# Patient Record
Sex: Female | Born: 1995
Health system: Southern US, Community
[De-identification: ages and names within clinical notes are randomized; demographics above are authoritative.]

## PROBLEM LIST (undated history)

## (undated) DIAGNOSIS — F419 Anxiety disorder, unspecified: Secondary | ICD-10-CM

## (undated) DIAGNOSIS — F952 Tourette's disorder: Secondary | ICD-10-CM

## (undated) DIAGNOSIS — F32A Depression, unspecified: Secondary | ICD-10-CM

## (undated) DIAGNOSIS — F329 Major depressive disorder, single episode, unspecified: Secondary | ICD-10-CM

## (undated) DIAGNOSIS — G43909 Migraine, unspecified, not intractable, without status migrainosus: Secondary | ICD-10-CM

## (undated) DIAGNOSIS — T7840XA Allergy, unspecified, initial encounter: Secondary | ICD-10-CM

## (undated) HISTORY — DX: Allergy, unspecified, initial encounter: T78.40XA

## (undated) HISTORY — PX: NO PAST SURGERIES: SHX2092

---
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
## 2015-09-21 DIAGNOSIS — H1031 Unspecified acute conjunctivitis, right eye: Secondary | ICD-10-CM | POA: Diagnosis not present

## 2015-09-21 DIAGNOSIS — H1011 Acute atopic conjunctivitis, right eye: Secondary | ICD-10-CM | POA: Diagnosis not present

## 2015-09-21 MED FILL — predniSONE 20 MG TABS: 20 | 3 days supply | Qty: 6 | Fill #0

## 2015-09-21 MED FILL — raNITIdine HCL 150 MG TABS: 150 | 15 days supply | Qty: 30 | Fill #0

## 2016-05-02 DIAGNOSIS — Z30011 Encounter for initial prescription of contraceptive pills: Secondary | ICD-10-CM | POA: Diagnosis not present

## 2016-05-02 MED FILL — LARIN FE 1.5-30 TABLET: 1.5-30 | 28 days supply | Qty: 28 | Fill #0

## 2016-06-03 MED FILL — LARIN FE 1.5-30 TABLET: 1.5-30 | 84 days supply | Qty: 84 | Fill #1

## 2016-08-26 MED FILL — LARIN FE 1.5-30 TABLET: 1.5-30 | 84 days supply | Qty: 84 | Fill #2

## 2016-11-18 MED FILL — LARIN FE 1.5-30 TABLET: 1.5-30 | 84 days supply | Qty: 84 | Fill #3

## 2016-11-27 ENCOUNTER — Other Ambulatory Visit: Payer: Self-pay | Admitting: Family Medicine

## 2016-11-27 ENCOUNTER — Ambulatory Visit
Admission: RE | Admit: 2016-11-27 | Discharge: 2016-11-27 | Disposition: A | Discharge status: Home / Self Care | Payer: 59 | Source: Ambulatory Visit | Attending: Family Medicine | Admitting: Family Medicine

## 2016-11-27 DIAGNOSIS — M545 Low back pain, unspecified: Secondary | ICD-10-CM

## 2016-11-27 MED FILL — NAPROXEN 500 MG TABLET: 500 | 30 days supply | Qty: 60 | Fill #0

## 2016-11-27 MED FILL — CYCLOBENZAPRINE 10 MG TAB: 10 | 30 days supply | Qty: 30 | Fill #0

## 2017-01-07 ENCOUNTER — Ambulatory Visit: Payer: 59 | Attending: Family Medicine | Admitting: Physical Therapy

## 2017-01-07 ENCOUNTER — Encounter: Payer: Self-pay | Admitting: Physical Therapy

## 2017-01-07 DIAGNOSIS — M6281 Muscle weakness (generalized): Secondary | ICD-10-CM | POA: Diagnosis not present

## 2017-01-07 DIAGNOSIS — M545 Low back pain: Secondary | ICD-10-CM | POA: Diagnosis not present

## 2017-01-07 DIAGNOSIS — M62830 Muscle spasm of back: Secondary | ICD-10-CM | POA: Diagnosis not present

## 2017-01-07 DIAGNOSIS — G8929 Other chronic pain: Secondary | ICD-10-CM | POA: Insufficient documentation

## 2017-01-07 NOTE — Therapy (Signed)
[place], [place], [address] Phone: [phone]   Fax:  [phone]  Physical Therapy Evaluation  Patient Details  Name: Julie Warner MRN: [id number] Date of Birth: [birth year] Referring Provider: Alben Spittle MD  Encounter Date: 01/07/2017      PT End of Session - 01/07/17 1428    Visit Number 1   Number of Visits 13   Date for PT Re-Evaluation 02/18/17   PT Start Time 1330   PT Stop Time 1420   PT Time Calculation (min) 50 min   Activity Tolerance Patient tolerated treatment well   Behavior During Therapy [facility] for tasks assessed/performed      Past Medical History:  Diagnosis Date  . Allergy    seasonal     History reviewed. No pertinent surgical history.  There were no vitals filed for this visit.       Subjective Assessment - 01/07/17 1337    Subjective pt is a 21 y.o F with CC of low back pain that started about 4 years ago with no specific MOI which she thinks is from overdoing it. that was at it worst 1 year ago and she quit her job. and recently was exacerbated in July with no specific cause. pain is in low back and at worst can radiate up to the mid back. Denies N/T into the LE. since recent exacerbation in July pain seems to stay the same.    Limitations --  bending to far. or sitting up straight   How long can you sit comfortably? depends on pain level but 15- to 30 min   How long can you stand comfortably? 60 min   How long can you walk comfortably? 60 min    Diagnostic tests x-ray   Patient Stated Goals strength the back, fix the problem, decrease pain,    Currently in Pain? Yes   Pain Score 4    Pain Location Back   Pain Orientation Lower;Mid   Pain Descriptors / Indicators Tightness;Sore;Sharp   Pain Type Chronic pain   Pain Onset More than a month ago   Pain Frequency Intermittent   Aggravating Factors  bending, prolonged sitting, lifting/ carrying,    Pain Relieving  Factors getting out of the positiong, resting,             [facility] PT Assessment - 01/07/17 1329      Assessment   Medical Diagnosis Low back pain   Referring Provider Alben Spittle MD   Onset Date/Surgical Date --  4 years with recent July of 2018   Hand Dominance Right   Next MD Visit --  make on PRN   Prior Therapy no     Precautions   Precaution Comments stay off as much as possible, avoid strenous activity     Restrictions   Weight Bearing Restrictions No     Balance Screen   Has the patient fallen in the past 6 months No   Has the patient had a decrease in activity level because of a fear of falling?  No   Is the patient reluctant to leave their home because of a fear of falling?  No     Home Social worker Private residence   Living Arrangements Spouse/significant other   Available Help at Discharge Available PRN/intermittently   Type of [place] to enter   Entrance Stairs-Number of Steps 4   Entrance Stairs-Rails None  Home Layout Two level   Alternate Level Stairs-Number of Steps 14   Alternate Level Stairs-Rails Right   Home Equipment None     Prior Function   Level of Independence Independent;Independent with basic ADLs   Vocation Part time employment  subbing daycare   Vocation Requirements standing, walking, bending, sitting,    Leisure art, reading,      Cognition   Overall Cognitive Status Within Functional Limits for tasks assessed     Observation/Other Assessments   Focus on Therapeutic Outcomes (FOTO)  48% limited  predicted 32% limited     Posture/Postural Control   Posture/Postural Control Postural limitations     ROM / Strength   AROM / PROM / Strength AROM;Strength     AROM   AROM Assessment Site Lumbar   Lumbar Flexion 42  ERP   Lumbar Extension 20  ERP   Lumbar - Right Side Bend 10   Lumbar - Left Side Bend 10     Strength   Strength Assessment Site Hip;Knee   Right/Left Hip  Right;Left   Right Hip Flexion 4+/5   Right Hip Extension 4-/5   Right Hip ABduction 3+/5   Right Hip ADduction 4-/5   Left Hip Flexion 4+/5   Left Hip Extension 3+/5   Left Hip ABduction 3+/5   Left Hip ADduction 4-/5   Right/Left Knee Right;Left   Right Knee Flexion 5/5   Right Knee Extension 5/5   Left Knee Flexion 5/5   Left Knee Extension 5/5     Palpation   SI assessment  postivie flexion test with L PSIS limited movement.  (-) gillets test, (+) gaenslens test   Palpation comment TTP at the L PSIS, and L lumbar parapsinals     Special Tests    Special Tests Lumbar;Sacrolliac Tests   Sacroiliac Tests  Sacral Thrust     Sacral thrust    Findings Positive   Side Left            Objective measurements completed on examination: See above findings.          [place] Adult PT Treatment/Exercise - 01/07/17 1329      Lumbar Exercises: Stretches   Active Hamstring Stretch 3 reps;30 seconds  contract/ relax   Pelvic Tilt --  1 x 10 with 3 sec hold     Lumbar Exercises: Supine   Straight Leg Raise 15 reps  x 2 sets                PT Education - 01/07/17 1353    Education provided Yes   Education Details evaluation findings, POC, goals, HEP with form/ rationale. anatomy regarding low back and SIJ and muscle effects on SIJ   Person(s) Educated Patient;Spouse   Methods Explanation;Verbal cues;Handout   Comprehension Verbalized understanding;Verbal cues required          PT Short Term Goals - 01/07/17 1436      PT SHORT TERM GOAL #1   Title pt will be I with inital HEP   Time 3   Period Weeks   Status New   Target Date 01/28/17     PT SHORT TERM GOAL #2   Title pt will verbalize and demo proper posture with lifting and carrying mechanics to prevent / reduce low back pain   Time 3   Period Weeks   Status New   Target Date 01/28/17           PT Long Term Goals -  01/07/17 1438      PT LONG TERM GOAL #1   Title pt will increase trunk  flexion to >/= 70 degrees and extension/ bil side bending by >/= 8 degrees to promote functional mobility for ADLS   Time 6   Period Weeks   Status New   Target Date 02/18/17     PT LONG TERM GOAL #2   Title increase bil hip/ knee strength to >/= 4+/5 to promote hip stability and assist with proper lifting mechanics during job related activities    Time 6   Period Weeks   Status New   Target Date 02/18/17     PT LONG TERM GOAL #3   Title pt to increase FOTO score to </=32% limited to demo improvement in function   Time 6   Period Weeks   Status New   Target Date 02/18/17     PT LONG TERM GOAL #4   Title pt to be I with all HEP given as of last visit    Time 6   Period Weeks   Status New   Target Date 02/18/17                Plan - 01/07/17 1429    Clinical Impression Statement pt presents to OPPT with CC of chronic low back pain that started 4 years ago with recent exacerbationg in July with no specific MOI. She demonstrates limited trunk flexion/ extension due to pain and muscle tightness. speciall testing is consistent with posterior innominate rotation on the L and TTP at the L PSIS and L lumbar parapsinals. following hamstring stretching and SLR exericse she reported decreased pain with trunk flexion. she would benefit from physical therapy to decrease low back pain, improve trunk mobility, increase LE strength and return to PLOF by addressing the deficits listed below.    Clinical Presentation Stable   Clinical Decision Making Low   Rehab Potential Good   PT Frequency 2x / week   PT Duration 6 weeks   PT Treatment/Interventions ADLs/Self Care Home Management;Cryotherapy;Electrical Stimulation;Iontophoresis 4mg /ml Dexamethasone;Moist Heat;Ultrasound;Therapeutic activities;Therapeutic exercise;Dry needling;Manual techniques;Taping;Patient/family education;Passive range of motion;Neuromuscular re-education   PT Next Visit Plan review and updated HEP PRN, L innominate  posterior rotation, hamstring stretching, METs, core work, manual for lumbar paraspinals, cat/cow   PT Home Exercise Plan lower trunk rotation, SLR, hamstring stretch, posterior pelvic tilt   Consulted and Agree with Plan of Care Patient      Patient will benefit from skilled therapeutic intervention in order to improve the following deficits and impairments:  Pain, Decreased balance, Decreased strength, Decreased range of motion, Decreased endurance, Decreased activity tolerance, Increased muscle spasms, Impaired flexibility, Improper body mechanics, Postural dysfunction  Visit Diagnosis: Chronic bilateral low back pain without sciatica - Plan: PT plan of care cert/re-cert  Muscle weakness (generalized) - Plan: PT plan of care cert/re-cert  Muscle spasm of back - Plan: PT plan of care cert/re-cert     Problem List There are no active problems to display for this patient.  Starr [place] PT, DPT, LAT, ATC  01/07/17  2:47 PM      [facility] [address] [place], [place], [address] Phone: [phone]   Fax:  [phone]  Name: Julie Warner MRN: [id number] Date of Birth: [birth year]

## 2017-01-14 ENCOUNTER — Ambulatory Visit: Payer: 59 | Admitting: Physical Therapy

## 2017-01-14 ENCOUNTER — Encounter: Payer: Self-pay | Admitting: Physical Therapy

## 2017-01-14 DIAGNOSIS — M62830 Muscle spasm of back: Secondary | ICD-10-CM | POA: Diagnosis not present

## 2017-01-14 DIAGNOSIS — M6281 Muscle weakness (generalized): Secondary | ICD-10-CM | POA: Diagnosis not present

## 2017-01-14 DIAGNOSIS — M545 Low back pain, unspecified: Secondary | ICD-10-CM

## 2017-01-14 DIAGNOSIS — G8929 Other chronic pain: Secondary | ICD-10-CM | POA: Diagnosis not present

## 2017-01-14 NOTE — Therapy (Signed)
[place], [place], [address] Phone: [phone]   Fax:  [phone]  Physical Therapy Treatment  Patient Details  Name: Julie Warner MRN: [id number] Date of Birth: 07/20/1995 Referring Provider: Alben Spittle MD  Encounter Date: 01/14/2017      PT End of Session - 01/14/17 1707    Visit Number 2   Number of Visits 13   Date for PT Re-Evaluation 02/18/17   PT Start Time 1615   Activity Tolerance Patient tolerated treatment well   Behavior During Therapy [facility] for tasks assessed/performed      Past Medical History:  Diagnosis Date  . Allergy    seasonal     History reviewed. No pertinent surgical history.  There were no vitals filed for this visit.      Subjective Assessment - 01/14/17 1619    Subjective Medications will bring to next session.   As  long as I did the exercises I don't  have so much pain.  3/10 pain today.  After last visit,  the next day left leg was weak all day,  it was improving the next day    and continues today.  Her knee collapses the next morning,  no pain,  she felt her knee was loose in the bones.     Patient is accompained by: Family member  Husband   Currently in Pain? Yes   Pain Score 3    Pain Location Back   Pain Orientation Mid;Lower   Pain Descriptors / Indicators Sore   Pain Type Chronic pain   Pain Frequency Intermittent   Aggravating Factors  bending,  lifting / carrying,  longer sitting   Pain Relieving Factors exercises   Effect of Pain on Daily Activities Previous injury to left knee.  Knee gets sore.                          [place] Adult PT Treatment/Exercise - 01/14/17 0001      Lumbar Exercises: Stretches   Passive Hamstring Stretch 3 reps;30 seconds   Pelvic Tilt --  1 x 10 with 3 sec hold   Pelvic Tilt Limitations cued to avoid using legs.      Lumbar Exercises: Supine   Bridge 20 reps   Straight Leg Raise 20 reps  right      Lumbar Exercises: Sidelying   Other Sidelying Lumbar Exercises Quadratus lumborum stretch over pillow ,  HEP helped decrease pain.      Knee/Hip Exercises: Stretches   Sports administrator 3 reps;30 seconds   Hip Flexor Stretch 1 rep;30 seconds   Hip Flexor Stretch Limitations non tight today     Moist Heat Therapy   Number Minutes Moist Heat 15 Minutes   Moist Heat Location Lumbar Spine  Quadratus lumborum area     Electrical Stimulation   Electrical Stimulation Location Left low back gluteal   Electrical Stimulation Action IFC   Electrical Stimulation Parameters 5   Electrical Stimulation Goals Pain  concurrent with moist heat while stretching QL over pillow     Manual Therapy   Manual therapy comments increased pain to 5/10 with manual to quadratus lumborum..  Demo other QL stretches to try as needed.  ,,QL info sheet also issued and reviewed.                PT Education - 01/14/17 1707    Education provided Yes   Education Details HEP,  Quadratus lumborum stretches.   Person(s) Educated Patient;Spouse   Methods Explanation;Demonstration;Verbal cues;Handout   Comprehension Verbalized understanding;Returned demonstration          PT Short Term Goals - 01/07/17 1436      PT SHORT TERM GOAL #1   Title pt will be I with inital HEP   Time 3   Period Weeks   Status New   Target Date 01/28/17     PT SHORT TERM GOAL #2   Title pt will verbalize and demo proper posture with lifting and carrying mechanics to prevent / reduce low back pain   Time 3   Period Weeks   Status New   Target Date 01/28/17           PT Long Term Goals - 01/07/17 1438      PT LONG TERM GOAL #1   Title pt will increase trunk flexion to >/= 70 degrees and extension/ bil side bending by >/= 8 degrees to promote functional mobility for ADLS   Time 6   Period Weeks   Status New   Target Date 02/18/17     PT LONG TERM GOAL #2   Title increase bil hip/ knee strength to >/= 4+/5 to  promote hip stability and assist with proper lifting mechanics during job related activities    Time 6   Period Weeks   Status New   Target Date 02/18/17     PT LONG TERM GOAL #3   Title pt to increase FOTO score to </=32% limited to demo improvement in function   Time 6   Period Weeks   Status New   Target Date 02/18/17     PT LONG TERM GOAL #4   Title pt to be I with all HEP given as of last visit    Time 6   Period Weeks   Status New   Target Date 02/18/17               Plan - 01/14/17 1719    Clinical Impression Statement Pain decreased with PT exercises  with decreased posterior rotation of left innominate.   Pain in left quadratus lumborum increased with manual  to 5/10.  pain decreased to 2/10 post modalities and stretching.    PT Next Visit Plan review and updated HEP PRN, L innominate posterior rotation, hamstring stretching, METs, core work, manual for lumbar paraspinals, cat/cow   PT Home Exercise Plan lower trunk rotation, SLR, hamstring stretch, posterior pelvic tilt,  QL stretching.   Consulted and Agree with Plan of Care Patient;Family member/caregiver   Family Member Consulted Husband      Patient will benefit from skilled therapeutic intervention in order to improve the following deficits and impairments:  Pain, Decreased balance, Decreased strength, Decreased range of motion, Decreased endurance, Decreased activity tolerance, Increased muscle spasms, Impaired flexibility, Improper body mechanics, Postural dysfunction  Visit Diagnosis: Chronic bilateral low back pain without sciatica  Muscle weakness (generalized)  Muscle spasm of back     Problem List There are no active problems to display for this patient.   HARRIS,KAREN PTA 01/14/2017, 5:24 PM  [facility] [address] [place], [place], [address] Phone: [phone]   Fax:  [phone]  Name: Julie Warner MRN: [id number] Date of  Birth: 08-Nov-1995

## 2017-01-14 NOTE — Patient Instructions (Signed)
From exercise drawer:  Quadratus stretches to be done PRN 3-5 X each 5  To 30 minutes each/   All issued for her to try.

## 2017-01-19 ENCOUNTER — Encounter: Payer: Self-pay | Admitting: Physical Therapy

## 2017-01-19 ENCOUNTER — Ambulatory Visit: Payer: 59 | Admitting: Physical Therapy

## 2017-01-19 DIAGNOSIS — M545 Low back pain, unspecified: Secondary | ICD-10-CM

## 2017-01-19 DIAGNOSIS — G8929 Other chronic pain: Secondary | ICD-10-CM

## 2017-01-19 DIAGNOSIS — M62830 Muscle spasm of back: Secondary | ICD-10-CM

## 2017-01-19 DIAGNOSIS — M6281 Muscle weakness (generalized): Secondary | ICD-10-CM

## 2017-01-19 NOTE — Therapy (Signed)
[place], [place], [address] Phone: [phone]   Fax:  [phone]  Physical Therapy Treatment  Patient Details  Name: Julie Warner MRN: [id number] Date of Birth: 1995-08-05 Referring Provider: Alben Spittle MD  Encounter Date: 01/19/2017      PT End of Session - 01/19/17 1706    Visit Number 3   Number of Visits 13   Date for PT Re-Evaluation 02/18/17   PT Start Time 7371   PT Stop Time 1700   PT Time Calculation (min) 45 min   Activity Tolerance Patient tolerated treatment well   Behavior During Therapy [facility] for tasks assessed/performed      Past Medical History:  Diagnosis Date  . Allergy    seasonal     History reviewed. No pertinent surgical history.  There were no vitals filed for this visit.      Subjective Assessment - 01/19/17 1622    Subjective 2/10.  Doing her home exercises.  Stretches really help.  More relaxed today than usual.   Patient is accompained by: Family member  sister   Currently in Pain? Yes   Pain Score 2    Pain Location Back   Pain Orientation Mid;Lower   Pain Descriptors / Indicators Sore;Constant;Discomfort   Pain Type Chronic pain   Aggravating Factors  bending, lifting,  carrying,  sitting unsupported and sitting too long.   Pain Relieving Factors stretches,  driving with the seat upright,                          [facility] Adult PT Treatment/Exercise - 01/19/17 0001      Lumbar Exercises: Stretches   Passive Hamstring Stretch 3 reps;30 seconds   Passive Hamstring Stretch Limitations hard end range noted   Lower Trunk Rotation 3 reps;30 seconds  right only   Pelvic Tilt 10 seconds   Quadruped Mid Back Stretch Limitations Child's post 60 seconds X 1,  same with stretches to QL  30 seconds  X 1 each     Lumbar Exercises: Supine   Clam 5 reps  single and double,  cued for control and technique   Heel Slides 5 reps  moving about 6  inches each with abdominal bracing.     Bridge 20 reps   Straight Leg Raise 20 reps  to assist with lowering right      Lumbar Exercises: Sidelying   Clam 5 reps  each,  pilates style,  left harder to do      Knee/Hip Exercises: Stretches   Quad Stretch 3 reps;30 seconds   Quad Stretch Limitations off edge of mat                  PT Short Term Goals - 01/19/17 1707      PT SHORT TERM GOAL #1   Title pt will be I with inital HEP   Baseline independent with exercises added so far   Time 3   Period Weeks   Status On-going     PT SHORT TERM GOAL #2   Title pt will verbalize and demo proper posture with lifting and carrying mechanics to prevent / reduce low back pain   Time 3   Status Unable to assess           PT Long Term Goals - 01/07/17 1438      PT LONG TERM GOAL #1   Title pt will increase trunk  flexion to >/= 70 degrees and extension/ bil side bending by >/= 8 degrees to promote functional mobility for ADLS   Time 6   Period Weeks   Status New   Target Date 02/18/17     PT LONG TERM GOAL #2   Title increase bil hip/ knee strength to >/= 4+/5 to promote hip stability and assist with proper lifting mechanics during job related activities    Time 6   Period Weeks   Status New   Target Date 02/18/17     PT LONG TERM GOAL #3   Title pt to increase FOTO score to </=32% limited to demo improvement in function   Time 6   Period Weeks   Status New   Target Date 02/18/17     PT LONG TERM GOAL #4   Title pt to be I with all HEP given as of last visit    Time 6   Period Weeks   Status New   Target Date 02/18/17               Plan - 01/19/17 1708    Clinical Impression Statement Pain did not increase with session today,  She declined the need for modalities with a pain of 2/10.  Left innominate less rotated at the end of session.  Patient was able to tolerate beginning core stabilization.    PT Treatment/Interventions ADLs/Self Care Home  Management;Cryotherapy;Electrical Stimulation;Iontophoresis 4mg /ml Dexamethasone;Moist Heat;Ultrasound;Therapeutic activities;Therapeutic exercise;Dry needling;Manual techniques;Taping;Patient/family education;Passive range of motion;Neuromuscular re-education   PT Next Visit Plan review and updated HEP PRN, L innominate posterior rotation, hamstring stretching, METs, core work, manual for lumbar paraspinals, cat/cow   PT Home Exercise Plan lower trunk rotation, SLR, hamstring stretch, posterior pelvic tilt,  QL stretching.   Consulted and Agree with Plan of Care Patient   Family Member Consulted Sister      Patient will benefit from skilled therapeutic intervention in order to improve the following deficits and impairments:  Pain, Decreased balance, Decreased strength, Decreased range of motion, Decreased endurance, Decreased activity tolerance, Increased muscle spasms, Impaired flexibility, Improper body mechanics, Postural dysfunction  Visit Diagnosis: Chronic bilateral low back pain without sciatica  Muscle weakness (generalized)  Muscle spasm of back     Problem List There are no active problems to display for this patient.   HARRIS,KAREN  PTA 01/19/2017, 5:13 PM  [facility] [address] [place], [place], [address] Phone: [phone]   Fax:  [phone]  Name: Julie Warner MRN: [id number] Date of Birth: 04/19/1996

## 2017-01-21 ENCOUNTER — Ambulatory Visit: Payer: 59 | Admitting: Physical Therapy

## 2017-01-21 ENCOUNTER — Encounter: Payer: Self-pay | Admitting: Physical Therapy

## 2017-01-21 DIAGNOSIS — M62830 Muscle spasm of back: Secondary | ICD-10-CM | POA: Diagnosis not present

## 2017-01-21 DIAGNOSIS — M6281 Muscle weakness (generalized): Secondary | ICD-10-CM

## 2017-01-21 DIAGNOSIS — G8929 Other chronic pain: Secondary | ICD-10-CM | POA: Diagnosis not present

## 2017-01-21 DIAGNOSIS — M545 Low back pain: Principal | ICD-10-CM

## 2017-01-21 NOTE — Therapy (Signed)
[place], [place], [address] Phone: [phone]   Fax:  [phone]  Physical Therapy Treatment  Patient Details  Name: Julie Warner MRN: [id number] Date of Birth: [birth year] Referring Provider: Alben Spittle MD  Encounter Date: 01/21/2017      PT End of Session - 01/21/17 1713    Visit Number 4   Number of Visits 13   Date for PT Re-Evaluation 02/18/17   PT Start Time 7939   PT Stop Time 1703   PT Time Calculation (min) 48 min   Activity Tolerance Patient tolerated treatment well   Behavior During Therapy [facility] for tasks assessed/performed      Past Medical History:  Diagnosis Date  . Allergy    seasonal     History reviewed. No pertinent surgical history.  There were no vitals filed for this visit.      Subjective Assessment - 01/21/17 1616    Subjective Pain 6-7 /10 yesterday.  Incerased with stress.   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  Maybe my other hip was out of wack.   Patient is accompained by: Family member  husband.   Currently in Pain? Yes   Pain Score --  6-7/10 yesterday,  nothing helped yesterday.   Pain Location Back   Pain Orientation Mid;Lower   Pain Descriptors / Indicators Constant   Pain Type Chronic pain   Aggravating Factors  high stress days.   Pain Relieving Factors muscle ralaxer was the only thing that worked yesterday                         [facility] Adult PT Treatment/Exercise - 01/21/17 0001      Therapeutic Activites    Therapeutic Activities Lifting   Lifting from mat , from floor.  Correct techniques  ball and purse.     Lumbar Exercises: Stretches   Passive Hamstring Stretch 3 reps;30 seconds   Passive Hamstring Stretch Limitations 90/.90  vs strap,  each   Quadruped Mid Back Stretch --  cued to not force stretches.   Quadruped Mid Back Stretch Limitations Child's post 60 seconds X 1,  same with stretches to QL  30  seconds  X 1 each  also cat camel .  Cat feels good ,  camel decreased ROM     Lumbar Exercises: Supine   Clam 5 reps  single and double,  cued for control and technique   Clam Limitations also 10 X with red band no increased pain with abdominal bracing.      Manual Therapy   Kinesiotex Inhibit Muscle     Kinesiotix   Inhibit Muscle  Paraspinals.  2 inch anchor at sacrum then tape split in half and applied off the paper along bilateral paraspinals to around  T3 with last 2 inches no tension.   Taps applied with patient curled into flexion hands resting on mat.                  PT Education - 01/21/17 1713    Education provided Yes   Education Details How tape works.   Person(s) Educated Patient;Spouse   Methods Explanation   Comprehension Verbalized understanding          PT Short Term Goals - 01/21/17 1639      PT SHORT TERM GOAL #1   Title pt will be I with inital HEP   Baseline independent with  exercises added so far   Time 3   Period Weeks   Status On-going     PT SHORT TERM GOAL #2   Title pt will verbalize and demo proper posture with lifting and carrying mechanics to prevent / reduce low back pain   Baseline able   Time 3   Period Weeks   Status Achieved           PT Long Term Goals - 01/07/17 1438      PT LONG TERM GOAL #1   Title pt will increase trunk flexion to >/= 70 degrees and extension/ bil side bending by >/= 8 degrees to promote functional mobility for ADLS   Time 6   Period Weeks   Status New   Target Date 02/18/17     PT LONG TERM GOAL #2   Title increase bil hip/ knee strength to >/= 4+/5 to promote hip stability and assist with proper lifting mechanics during job related activities    Time 6   Period Weeks   Status New   Target Date 02/18/17     PT LONG TERM GOAL #3   Title pt to increase FOTO score to </=32% limited to demo improvement in function   Time 6   Period Weeks   Status New   Target Date 02/18/17     PT  LONG TERM GOAL #4   Title pt to be I with all HEP given as of last visit    Time 6   Period Weeks   Status New   Target Date 02/18/17               Plan - 01/21/17 1714    Clinical Impression Statement Pain 1.5 at end of session.  STG#2 met.  She is able to demonstrate correct technique with lifting. .  Decreased flexibility in lumbar spine with camel stretch.  Able to have neutral innominates with exercises today.    PT Next Visit Plan review and updated HEP PRN, Check for RT / or L innominate posterior rotation  (Patient thinks it was rotated on the right yesterday), hamstring stretching, METs, core work, manual for lumbar paraspinals, cat/cow. Assess tape for paraspinals   PT Home Exercise Plan lower trunk rotation, SLR, hamstring stretch, posterior pelvic tilt,  QL stretching.   Consulted and Agree with Plan of Care Patient;Family member/caregiver   Family Member Consulted Husband      Patient will benefit from skilled therapeutic intervention in order to improve the following deficits and impairments:     Visit Diagnosis: Chronic bilateral low back pain without sciatica  Muscle weakness (generalized)  Muscle spasm of back     Problem List There are no active problems to display for this patient.   Amare Bail PTA 01/21/2017, 5:21 PM  [facility] [address] [place], [place], [address] Phone: [phone]   Fax:  [phone]  Name: Julie Warner MRN: [id number] Date of Birth: 09-22-1995

## 2017-01-21 NOTE — Patient Instructions (Signed)
Remove tape if irritating 

## 2017-01-26 ENCOUNTER — Encounter: Payer: Self-pay | Admitting: Physical Therapy

## 2017-01-26 ENCOUNTER — Ambulatory Visit: Payer: 59 | Admitting: Physical Therapy

## 2017-01-26 DIAGNOSIS — M545 Low back pain, unspecified: Secondary | ICD-10-CM

## 2017-01-26 DIAGNOSIS — M62830 Muscle spasm of back: Secondary | ICD-10-CM | POA: Diagnosis not present

## 2017-01-26 DIAGNOSIS — G8929 Other chronic pain: Secondary | ICD-10-CM | POA: Diagnosis not present

## 2017-01-26 DIAGNOSIS — M6281 Muscle weakness (generalized): Secondary | ICD-10-CM

## 2017-01-26 NOTE — Therapy (Signed)
[place], [place], [address] Phone: [phone]   Fax:  [phone]  Physical Therapy Treatment  Patient Details  Name: Julie Warner MRN: [id number] Date of Birth: 1996/01/31 Referring Provider: Alben Spittle MD  Encounter Date: 01/26/2017      PT End of Session - 01/26/17 1647    Visit Number 5   Number of Visits 13   Date for PT Re-Evaluation 02/18/17   PT Start Time 1501   PT Stop Time 1546   PT Time Calculation (min) 45 min   Activity Tolerance Patient tolerated treatment well   Behavior During Therapy [facility] for tasks assessed/performed      Past Medical History:  Diagnosis Date  . Allergy    seasonal     History reviewed. No pertinent surgical history.  There were no vitals filed for this visit.      Subjective Assessment - 01/26/17 1508    Subjective "I can say I am doing better, still some soreness in the back with lifting/ sitting"   Currently in Pain? Yes   Pain Score 2    Pain Location Back   Pain Orientation Mid;Lower   Pain Type Chronic pain   Pain Frequency Intermittent                         [facility] Adult PT Treatment/Exercise - 01/26/17 1510      Therapeutic Activites    Therapeutic Activities --   Lifting --  ball and purse.     Lumbar Exercises: Stretches   Passive Hamstring Stretch 2 reps;30 seconds   Passive Hamstring Stretch Limitations --   Quadruped Mid Back Stretch --  cued to not force stretches.   Quadruped Mid Back Stretch Limitations Child's post 60 seconds X 1,  same with stretches to QL  30 seconds  X 1 each  also cat camel .  Cat feels good ,  camel decreased ROM     Lumbar Exercises: Seated   Sit to Stand 10 reps   Sit to Stand Limitations focusing on keeping/ maintaining good back position. (verbal/ visual cues to keep feet aligned to assist with standing from seated postion, pt kept trying to stagger legs pushing more throught  the LLE).     Lumbar Exercises: Supine   Clam 15 reps  single and double,  cued for control and technique   Clam Limitations also 10 X with red band no increased pain with abdominal bracing.    Bent Knee Raise --  2 x 12    Bent Knee Raise Limitations with red band beneath back and verbal cues to push back on to band   Other Supine Lumbar Exercises tabletop position 4 x 15 sec     Lumbar Exercises: Sidelying   Other Sidelying Lumbar Exercises Book opening  2 x 10      Lumbar Exercises: Quadruped   Other Quadruped Lumbar Exercises anterior/ posterior rocking keeping back in neutral pos.  1 x 10     Manual Therapy   Manual Therapy Soft tissue mobilization   Manual therapy comments manual trigger point release over bil lumbar paraspinal   Soft tissue mobilization DTM over bil thoracic paraspinals   Kinesiotex --     Kinesiotix   Inhibit Muscle  --                PT Education - 01/26/17 1646    Education provided Yes  Education Details sit to standing avoiding staggering feet due to placing too much stress through 1 LLE compared to using both LEs   Person(s) Educated Patient   Methods Explanation;Verbal cues;Tactile cues;Demonstration   Comprehension Verbalized understanding;Verbal cues required;Returned demonstration          PT Short Term Goals - 01/21/17 1639      PT SHORT TERM GOAL #1   Title pt will be I with inital HEP   Baseline independent with exercises added so far   Time 3   Period Weeks   Status On-going     PT SHORT TERM GOAL #2   Title pt will verbalize and demo proper posture with lifting and carrying mechanics to prevent / reduce low back pain   Baseline able   Time 3   Period Weeks   Status Achieved           PT Long Term Goals - 01/07/17 1438      PT LONG TERM GOAL #1   Title pt will increase trunk flexion to >/= 70 degrees and extension/ bil side bending by >/= 8 degrees to promote functional mobility for ADLS   Time 6   Period  Weeks   Status New   Target Date 02/18/17     PT LONG TERM GOAL #2   Title increase bil hip/ knee strength to >/= 4+/5 to promote hip stability and assist with proper lifting mechanics during job related activities    Time 6   Period Weeks   Status New   Target Date 02/18/17     PT LONG TERM GOAL #3   Title pt to increase FOTO score to </=32% limited to demo improvement in function   Time 6   Period Weeks   Status New   Target Date 02/18/17     PT LONG TERM GOAL #4   Title pt to be I with all HEP given as of last visit    Time 6   Period Weeks   Status New   Target Date 02/18/17               Plan - 01/26/17 1648    Clinical Impression Statement pt reported 2/10 pain beginning session with report of 0/10 pain at end session. Following manual techniques focused on core work and maintaining proper posture with sit to stand which she required multiple cues for form. post session she declined modalities.    PT Next Visit Plan review and updated HEP PRN, Check for RT / or L innominate posterior rotation PRN,  hamstring stretching, METs, core work, Engineer, production.    PT Home Exercise Plan lower trunk rotation, SLR, hamstring stretch, posterior pelvic tilt,  QL stretching.   Consulted and Agree with Plan of Care Patient      Patient will benefit from skilled therapeutic intervention in order to improve the following deficits and impairments:     Visit Diagnosis: Chronic bilateral low back pain without sciatica  Muscle weakness (generalized)  Muscle spasm of back     Problem List There are no active problems to display for this patient.  Julie Warner PT, DPT, LAT, ATC  01/26/17  4:51 PM      [facility] [address] [place], [place], [address] Phone: [phone]   Fax:  [phone]  Name: Julie Warner MRN: [id number] Date of Birth: [birth year]

## 2017-01-28 ENCOUNTER — Ambulatory Visit: Payer: 59 | Admitting: Physical Therapy

## 2017-01-28 DIAGNOSIS — M62830 Muscle spasm of back: Secondary | ICD-10-CM

## 2017-01-28 DIAGNOSIS — M545 Low back pain, unspecified: Secondary | ICD-10-CM

## 2017-01-28 DIAGNOSIS — M6281 Muscle weakness (generalized): Secondary | ICD-10-CM | POA: Diagnosis not present

## 2017-01-28 DIAGNOSIS — G8929 Other chronic pain: Secondary | ICD-10-CM

## 2017-01-28 NOTE — Therapy (Signed)
[place], [place], [address] Phone: [phone]   Fax:  [phone]  Physical Therapy Treatment  Patient Details  Name: Julie Warner MRN: [id number] Date of Birth: 11-Mar-1996 Referring Provider: Alben Spittle MD  Encounter Date: 01/28/2017      PT End of Session - 01/28/17 1724    Visit Number 6   Number of Visits 13   Date for PT Re-Evaluation 02/18/17   PT Start Time 1631   PT Stop Time 1714   PT Time Calculation (min) 43 min   Activity Tolerance Patient tolerated treatment well   Behavior During Therapy [facility] for tasks assessed/performed      Past Medical History:  Diagnosis Date  . Allergy    seasonal     No past surgical history on file.  There were no vitals filed for this visit.      Subjective Assessment - 01/28/17 1638    Subjective " I have been practicing sitting better, and the sit to stand exercise but I am having alittle more soreness today"    Currently in Pain? Yes   Pain Score 3    Pain Orientation Left;Mid;Lower   Pain Descriptors / Indicators Aching;Sore   Aggravating Factors  work   Pain Relieving Factors medication.                          [facility] Adult PT Treatment/Exercise - 01/28/17 1639      Lumbar Exercises: Stretches   Quadruped Mid Back Stretch Limitations childs pose 2 x 30 sec  QL stretch 2 x 30 sec hold     Lumbar Exercises: Aerobic   Stationary Bike Nu-Step L 6 x 5 min  UE/LE     Lumbar Exercises: Seated   Hip Flexion on [employer];Other (comment)  seated on dyna disc   Hip Flexion on Ball Limitations horizontal lift/chop exercise 2 x 10 bil with red theraband, 2 x 10 palloff press     Lumbar Exercises: Supine   Bent Knee Raise --  2 x10   Bent Knee Raise Limitations with red band beneath back and verbal cues to push back on to band     Manual Therapy   Manual therapy comments manual trigger point release over  bil lumbar paraspinal  educated how to perform with theracane                PT Education - 01/28/17 1723    Education provided Yes   Education Details updated HEP for QL stretch and benefits of using theracane to work on trigger points.    Person(s) Educated Patient   Methods Explanation;Verbal cues;Handout   Comprehension Verbalized understanding;Verbal cues required          PT Short Term Goals - 01/21/17 1639      PT SHORT TERM GOAL #1   Title pt will be I with inital HEP   Baseline independent with exercises added so far   Time 3   Period Weeks   Status On-going     PT SHORT TERM GOAL #2   Title pt will verbalize and demo proper posture with lifting and carrying mechanics to prevent / reduce low back pain   Baseline able   Time 3   Period Weeks   Status Achieved           PT Long Term Goals - 01/07/17 1438      PT LONG  TERM GOAL #1   Title pt will increase trunk flexion to >/= 70 degrees and extension/ bil side bending by >/= 8 degrees to promote functional mobility for ADLS   Time 6   Period Weeks   Status New   Target Date 02/18/17     PT LONG TERM GOAL #2   Title increase bil hip/ knee strength to >/= 4+/5 to promote hip stability and assist with proper lifting mechanics during job related activities    Time 6   Period Weeks   Status New   Target Date 02/18/17     PT LONG TERM GOAL #3   Title pt to increase FOTO score to </=32% limited to demo improvement in function   Time 6   Period Weeks   Status New   Target Date 02/18/17     PT LONG TERM GOAL #4   Title pt to be I with all HEP given as of last visit    Time 6   Period Weeks   Status New   Target Date 02/18/17               Plan - 01/28/17 1724    Clinical Impression Statement pt repored increased soreness in the L low back and around the QL. continued MTPR techniques and educated how to use theracane. following stretches and core strengthening she reported decreased  pain to 1/10.    PT Next Visit Plan update HEP PRN, Check for RT / or L innominate posterior rotation PRN,  hamstring stretching, METs, core work, Engineer, production.    PT Home Exercise Plan lower trunk rotation, SLR, hamstring stretch, posterior pelvic tilt,  QL stretching.   Consulted and Agree with Plan of Care Patient      Patient will benefit from skilled therapeutic intervention in order to improve the following deficits and impairments:     Visit Diagnosis: Chronic bilateral low back pain without sciatica  Muscle spasm of back  Muscle weakness (generalized)     Problem List There are no active problems to display for this patient.  Starr [place] PT, DPT, LAT, ATC  01/28/17  5:26 PM      [facility] [address] [place], [place], [address] Phone: [phone]   Fax:  [phone]  Name: Julie Warner MRN: [id number] Date of Birth: 08-01-1995

## 2017-02-02 ENCOUNTER — Encounter: Payer: Self-pay | Admitting: Physical Therapy

## 2017-02-02 ENCOUNTER — Ambulatory Visit: Payer: 59 | Attending: Family Medicine | Admitting: Physical Therapy

## 2017-02-02 DIAGNOSIS — M6281 Muscle weakness (generalized): Secondary | ICD-10-CM | POA: Insufficient documentation

## 2017-02-02 DIAGNOSIS — M62830 Muscle spasm of back: Secondary | ICD-10-CM | POA: Insufficient documentation

## 2017-02-02 DIAGNOSIS — M545 Low back pain, unspecified: Secondary | ICD-10-CM

## 2017-02-02 DIAGNOSIS — G8929 Other chronic pain: Secondary | ICD-10-CM | POA: Insufficient documentation

## 2017-02-02 NOTE — Therapy (Signed)
[place], [place], [address] Phone: [phone]   Fax:  [phone]  Physical Therapy Treatment  Patient Details  Name: Julie Warner MRN: [id number] Date of Birth: Aug 10, 1995 Referring Provider: Alben Spittle MD  Encounter Date: 02/02/2017      PT End of Session - 02/02/17 1634    Visit Number 7   Number of Visits 13   Date for PT Re-Evaluation 02/18/17   PT Start Time 1632   PT Stop Time 1717   PT Time Calculation (min) 45 min   Activity Tolerance Patient tolerated treatment well   Behavior During Therapy [facility] for tasks assessed/performed      Past Medical History:  Diagnosis Date  . Allergy    seasonal     History reviewed. No pertinent surgical history.  There were no vitals filed for this visit.      Subjective Assessment - 02/02/17 1633    Subjective "I am doing better, still some pain but 1/10.    Currently in Pain? Yes   Pain Score 1    Pain Location Back   Pain Orientation Left;Lower   Pain Type Chronic pain   Pain Onset More than a month ago   Pain Frequency Intermittent            [facility] PT Assessment - 02/02/17 1639      Observation/Other Assessments   Focus on Therapeutic Outcomes (FOTO)  33% limited     AROM   Lumbar Flexion 50   Lumbar Extension 20   Lumbar - Right Side Bend 20   Lumbar - Left Side Bend 40                     [facility] Adult PT Treatment/Exercise - 02/02/17 1654      Lumbar Exercises: Stretches   Passive Hamstring Stretch 3 reps;30 seconds  contract/ relax with 10 sec hold   Quadruped Mid Back Stretch Limitations childs pose 2 x 30 sec     Lumbar Exercises: Standing   Functional Squats 10 reps  2 sets   Functional Squats Limitations verbal cues for proper form avoiding stagger stepping and using bil LE   Forward Lunge 10 reps  x 2 bil   Forward Lunge Limitations tactile cues to avoid rocking lead leg knee over toes     Lumbar Exercises: Supine   Straight Leg Raise 15 reps  x 2 sets    Straight Leg Raises Limitations small oscillations x 30 sec at end of reps   Other Supine Lumbar Exercises dead bug with alternating contralateral UE/LE movement 3 x 12  cues to keep good position and for ADIM                PT Education - 02/02/17 1720    Education provided Yes   Education Details updated HEP for lunges   Person(s) Educated Patient   Methods Explanation;Verbal cues;Handout   Comprehension Verbalized understanding;Verbal cues required          PT Short Term Goals - 01/21/17 1639      PT SHORT TERM GOAL #1   Title pt will be I with inital HEP   Baseline independent with exercises added so far   Time 3   Period Weeks   Status On-going     PT SHORT TERM GOAL #2   Title pt will verbalize and demo proper posture with lifting and carrying mechanics to prevent / reduce low  back pain   Baseline able   Time 3   Period Weeks   Status Achieved           PT Long Term Goals - 01/07/17 1438      PT LONG TERM GOAL #1   Title pt will increase trunk flexion to >/= 70 degrees and extension/ bil side bending by >/= 8 degrees to promote functional mobility for ADLS   Time 6   Period Weeks   Status New   Target Date 02/18/17     PT LONG TERM GOAL #2   Title increase bil hip/ knee strength to >/= 4+/5 to promote hip stability and assist with proper lifting mechanics during job related activities    Time 6   Period Weeks   Status New   Target Date 02/18/17     PT LONG TERM GOAL #3   Title pt to increase FOTO score to </=32% limited to demo improvement in function   Time 6   Period Weeks   Status New   Target Date 02/18/17     PT LONG TERM GOAL #4   Title pt to be I with all HEP given as of last visit    Time 6   Period Weeks   Status New   Target Date 02/18/17               Plan - 02/02/17 1720    Clinical Impression Statement pt reported 1/10 pain at the beginning of  treatment. continued stretch for low back and QL, with MTPR for L QL. continued working on core strengthening and hip strengthening which pt required multiple verbal cues on proper squating and lunge form to while maintaining trunk posture. pt reported no pain post session.    PT Next Visit Plan ERO Vs. discharge. update HEP PRN, Check for RT / or L innominate posterior rotation PRN,  hamstring stretching, METs, core work, Engineer, production.    PT Home Exercise Plan lower trunk rotation, SLR, hamstring stretch, posterior pelvic tilt,  QL stretching. lunge   Consulted and Agree with Plan of Care Patient      Patient will benefit from skilled therapeutic intervention in order to improve the following deficits and impairments:  Pain, Decreased balance, Decreased strength, Decreased range of motion, Decreased endurance, Decreased activity tolerance, Increased muscle spasms, Impaired flexibility, Improper body mechanics, Postural dysfunction  Visit Diagnosis: Chronic bilateral low back pain without sciatica  Muscle spasm of back  Muscle weakness (generalized)     Problem List There are no active problems to display for this patient.  Starr Lake PT, DPT, LAT, ATC  02/02/17  5:28 PM      [facility] [address] [place], [place], [address] Phone: [phone]   Fax:  [phone]  Name: Julie Warner MRN: [id number] Date of Birth: [birth year]

## 2017-02-04 ENCOUNTER — Encounter: Payer: Self-pay | Admitting: Physical Therapy

## 2017-02-04 ENCOUNTER — Ambulatory Visit: Payer: 59 | Admitting: Physical Therapy

## 2017-02-04 DIAGNOSIS — M545 Low back pain: Principal | ICD-10-CM

## 2017-02-04 DIAGNOSIS — G8929 Other chronic pain: Secondary | ICD-10-CM | POA: Diagnosis not present

## 2017-02-04 DIAGNOSIS — M62830 Muscle spasm of back: Secondary | ICD-10-CM | POA: Diagnosis not present

## 2017-02-04 DIAGNOSIS — M6281 Muscle weakness (generalized): Secondary | ICD-10-CM

## 2017-02-04 NOTE — Therapy (Signed)
[place], [place], [address] Phone: [phone]   Fax:  [phone]  Physical Therapy Treatment  Patient Details  Name: Julie Warner MRN: [id number] Date of Birth: 01-17-1996 Referring Provider: Alben Spittle MD  Encounter Date: 02/04/2017      PT End of Session - 02/04/17 1705    Visit Number 8   Number of Visits 13   Date for PT Re-Evaluation 02/18/17   PT Start Time 5643   PT Stop Time 1702   PT Time Calculation (min) 45 min   Activity Tolerance Patient tolerated treatment well   Behavior During Therapy [facility] for tasks assessed/performed      Past Medical History:  Diagnosis Date  . Allergy    seasonal     History reviewed. No pertinent surgical history.  There were no vitals filed for this visit.      Subjective Assessment - 02/04/17 1621    Subjective "no issues today"    Currently in Pain? No/denies                         [facility] Adult PT Treatment/Exercise - 02/04/17 1622      Lumbar Exercises: Stretches   Active Hamstring Stretch 2 reps;30 seconds   Quadruped Mid Back Stretch Limitations childs pose 2 x 30 sec  QL stretch 2 x 30 sec hold     Lumbar Exercises: Aerobic   Stationary Bike Nu-Step L 6 x 6 min     Lumbar Exercises: Standing   Functional Squats 5 reps  verbal cues to avoid letting knees drop in   Forward Lunge 10 reps   Other Standing Lumbar Exercises Monster walks 2 x 10 with red theraband   Other Standing Lumbar Exercises lateral band walks 2 x 20 ft with red theraband     Lumbar Exercises: Supine   Straight Leg Raise 15 reps  2 sets 4#   Straight Leg Raises Limitations small oscillations x 30 sec at end of reps     Lumbar Exercises: Sidelying   Hip Abduction --  2 x 15   Hip Abduction Weights (lbs) 4#     Lumbar Exercises: Quadruped   Single Arm Raise Right;Left;10 reps;1 second   Straight Leg Raise 10 reps;1 second   Opposite  Arm/Leg Raise Right arm/Left leg;Left arm/Right leg;10 reps  verbal/ tactile cues form keeping back in neutral position                  PT Short Term Goals - 01/21/17 1639      PT SHORT TERM GOAL #1   Title pt will be I with inital HEP   Baseline independent with exercises added so far   Time 3   Period Weeks   Status On-going     PT SHORT TERM GOAL #2   Title pt will verbalize and demo proper posture with lifting and carrying mechanics to prevent / reduce low back pain   Baseline able   Time 3   Period Weeks   Status Achieved           PT Long Term Goals - 01/07/17 1438      PT LONG TERM GOAL #1   Title pt will increase trunk flexion to >/= 70 degrees and extension/ bil side bending by >/= 8 degrees to promote functional mobility for ADLS   Time 6   Period Weeks   Status New  Target Date 02/18/17     PT LONG TERM GOAL #2   Title increase bil hip/ knee strength to >/= 4+/5 to promote hip stability and assist with proper lifting mechanics during job related activities    Time 6   Period Weeks   Status New   Target Date 02/18/17     PT LONG TERM GOAL #3   Title pt to increase FOTO score to </=32% limited to demo improvement in function   Time 6   Period Weeks   Status New   Target Date 02/18/17     PT LONG TERM GOAL #4   Title pt to be I with all HEP given as of last visit    Time 6   Period Weeks   Status New   Target Date 02/18/17               Plan - 02/04/17 1705    Clinical Impression Statement pt reports only 1/10 pain intially today. continued stretching of the low back and hips. pt was able to perform strengthenging in supine/ quadruped requiring verbal cues for proper form. post session she reported no pain. plan to see pt back in 2 weeks to assess carry over and if she is still doing well plan to D/C.   PT Next Visit Plan ERO Vs. discharge. update HEP PRN, Check for RT / or L innominate posterior rotation PRN,  hamstring  stretching, METs, core work, Engineer, production.    PT Home Exercise Plan lower trunk rotation, SLR, hamstring stretch, posterior pelvic tilt,  QL stretching. lunge   Consulted and Agree with Plan of Care Patient      Patient will benefit from skilled therapeutic intervention in order to improve the following deficits and impairments:  Pain, Decreased balance, Decreased strength, Decreased range of motion, Decreased endurance, Decreased activity tolerance, Increased muscle spasms, Impaired flexibility, Improper body mechanics, Postural dysfunction  Visit Diagnosis: Chronic bilateral low back pain without sciatica  Muscle spasm of back  Muscle weakness (generalized)     Problem List There are no active problems to display for this patient.  Starr Lake PT, DPT, LAT, ATC  02/04/17  5:09 PM      [facility] [address] [place], [place], [address] Phone: [phone]   Fax:  [phone]  Name: Julie Warner MRN: [id number] Date of Birth: 09/29/1995

## 2017-02-18 ENCOUNTER — Ambulatory Visit: Payer: 59 | Admitting: Physical Therapy

## 2017-02-18 ENCOUNTER — Encounter: Payer: Self-pay | Admitting: Physical Therapy

## 2017-02-18 DIAGNOSIS — G8929 Other chronic pain: Secondary | ICD-10-CM | POA: Diagnosis not present

## 2017-02-18 DIAGNOSIS — M62830 Muscle spasm of back: Secondary | ICD-10-CM

## 2017-02-18 DIAGNOSIS — M545 Low back pain: Principal | ICD-10-CM

## 2017-02-18 DIAGNOSIS — M6281 Muscle weakness (generalized): Secondary | ICD-10-CM | POA: Diagnosis not present

## 2017-02-18 NOTE — Therapy (Signed)
[place], [place], [address] Phone: [phone]   Fax:  [phone]  Physical Therapy Treatment / Discharge Summary  Patient Details  Name: Julie Warner MRN: [id number] Date of Birth: August 26, 1995 Referring Provider: Alben Spittle MD  Encounter Date: 02/18/2017      PT End of Session - 02/18/17 1500    Visit Number 9   Number of Visits 13   Date for PT Re-Evaluation 02/18/17   PT Start Time 1500   PT Stop Time 1535  shortened visit due to DC   PT Time Calculation (min) 35 min   Activity Tolerance Patient tolerated treatment well   Behavior During Therapy [facility] for tasks assessed/performed      Past Medical History:  Diagnosis Date  . Allergy    seasonal     History reviewed. No pertinent surgical history.  There were no vitals filed for this visit.      Subjective Assessment - 02/18/17 1459    Subjective " I have been doing really well with the occasional twinge" Work has been going really good.   Currently in Pain? No/denies            [facility] PT Assessment - 02/18/17 1506      Observation/Other Assessments   Focus on Therapeutic Outcomes (FOTO)  17% limited     AROM   Lumbar Flexion 100   Lumbar Extension 15   Lumbar - Right Side Bend 20   Lumbar - Left Side Bend 20     Strength   Right Hip Flexion 4+/5   Right Hip Extension 4/5   Right Hip ABduction 4/5   Right Hip ADduction 4/5   Left Hip Flexion 4+/5   Left Hip Extension 4/5   Left Hip ABduction 4/5   Left Hip ADduction 4/5   Right Knee Flexion 5/5   Right Knee Extension 5/5   Left Knee Flexion 5/5   Left Knee Extension 5/5                     [facility] Adult PT Treatment/Exercise - 02/18/17 0001      Lumbar Exercises: Standing   Other Standing Lumbar Exercises 2 x 10 steps 1 set with red theraband, 1 set with green theraband     Lumbar Exercises: Quadruped   Opposite Arm/Leg Raise Right arm/Left  leg;Left arm/Right leg;10 reps   Opposite Arm/Leg Raise Limitations tactile cues to prevent hip rotation                 PT Education - 02/18/17 1519    Education provided Yes   Education Details reviewed previously provided HEP and updated HEP for lateral hip strengthening and quadruped strengthening. How ot progress strengthening by increasing reps/ sets. Progress compared to inital evaluation.    Person(s) Educated Patient   Methods Explanation;Verbal cues;Handout   Comprehension Verbalized understanding;Verbal cues required          PT Short Term Goals - 02/18/17 1537      PT SHORT TERM GOAL #1   Title pt will be I with inital HEP   Time 3   Period Weeks   Status Achieved     PT SHORT TERM GOAL #2   Title pt will verbalize and demo proper posture with lifting and carrying mechanics to prevent / reduce low back pain   Time 3   Period Weeks   Status Achieved  PT Long Term Goals - 02/18/17 1537      PT LONG TERM GOAL #1   Title pt will increase trunk flexion to >/= 70 degrees and extension/ bil side bending by >/= 8 degrees to promote functional mobility for ADLS   Time 6   Period Weeks   Status Achieved     PT LONG TERM GOAL #2   Title increase bil hip/ knee strength to >/= 4+/5 to promote hip stability and assist with proper lifting mechanics during job related activities    Time 6   Period Weeks   Status Achieved     PT LONG TERM GOAL #3   Title pt to increase FOTO score to </=32% limited to demo improvement in function   Time 6   Period Weeks   Status Achieved     PT LONG TERM GOAL #4   Title pt to be I with all HEP given as of last visit    Time 6   Period Weeks   Status Achieved               Plan - 02/18/17 1535    Clinical Impression Statement Julie Warner has made great progress with physical therapy increasing back mobility and LE strength. additionally she reports no pain. She is able to perform all exercises without report  of pain. she met all goals today and is able to maintain and progress her current level of function independently and willbe d/C from Pt today.    PT Next Visit Plan d/C   PT Home Exercise Plan lower trunk rotation, SLR, hamstring stretch, posterior pelvic tilt,  QL stretching. lunge, bird dogs, and lateral band walks   Consulted and Agree with Plan of Care Patient      Patient will benefit from skilled therapeutic intervention in order to improve the following deficits and impairments:  Pain, Decreased balance, Decreased strength, Decreased range of motion, Decreased endurance, Decreased activity tolerance, Increased muscle spasms, Impaired flexibility, Improper body mechanics, Postural dysfunction  Visit Diagnosis: Chronic bilateral low back pain without sciatica  Muscle spasm of back     Problem List There are no active problems to display for this patient.  Julie Warner PT, DPT, LAT, ATC  02/18/17  3:38 PM      [facility] [address] [place], [place], [address] Phone: [phone]   Fax:  [phone]  Name: Julie Warner MRN: [id number] Date of Birth: Oct 14, 1995       PHYSICAL THERAPY DISCHARGE SUMMARY  Visits from Start of Care: 9  Current functional level related to goals / functional outcomes: See goals, FOTO 17% limited   Remaining deficits: Intermittent twinge that is fleeting that is less than 1/10   Education / Equipment: HEP, theraband, strengthening,  posture Plan: Patient agrees to discharge.  Patient goals were met. Patient is being discharged due to meeting the stated rehab goals.  ?????      Julie Warner PT, DPT, LAT, ATC  02/18/17  3:40 PM

## 2017-02-23 MED FILL — LARIN FE 1.5-30 TABLET: 1.5-30 | 56 days supply | Qty: 56 | Fill #4

## 2017-03-20 DIAGNOSIS — Z3041 Encounter for surveillance of contraceptive pills: Secondary | ICD-10-CM | POA: Diagnosis not present

## 2017-04-16 MED FILL — LARIN FE 1.5-30 TABLET: 1.5-30 | 84 days supply | Qty: 84 | Fill #0

## 2017-07-01 MED FILL — LARIN FE 1.5-30 TABLET: 1.5-30 | 84 days supply | Qty: 84 | Fill #1

## 2017-07-13 MED FILL — BROMPHENIR-PSEUDOEPHED-DM S: 30-2-10 | 5 days supply | Qty: 180 | Fill #0

## 2017-08-27 MED FILL — ESCITALOPRAM 10 MG TABLET: 10 | 30 days supply | Qty: 30 | Fill #0

## 2017-09-09 MED FILL — ALPRAZolam 0.25 MG TABS: 0.25 | 30 days supply | Qty: 15 | Fill #0

## 2017-09-11 MED FILL — SERTRALINE HCL 50 MG TABLET: 50 | 30 days supply | Qty: 30 | Fill #0

## 2017-09-30 MED FILL — LARIN FE 1.5-30 TABLET: 1.5-30 | 84 days supply | Qty: 84 | Fill #2

## 2017-10-15 MED FILL — SERTRALINE HCL 50 MG TABLET: 50 | 30 days supply | Qty: 30 | Fill #1

## 2017-11-16 MED FILL — SERTRALINE HCL 50 MG TABLET: 50 | 30 days supply | Qty: 30 | Fill #2

## 2017-12-08 ENCOUNTER — Ambulatory Visit (INDEPENDENT_AMBULATORY_CARE_PROVIDER_SITE_OTHER): Payer: No Typology Code available for payment source | Admitting: Physician Assistant

## 2017-12-08 ENCOUNTER — Ambulatory Visit (INDEPENDENT_AMBULATORY_CARE_PROVIDER_SITE_OTHER): Payer: No Typology Code available for payment source

## 2017-12-08 DIAGNOSIS — M25562 Pain in left knee: Secondary | ICD-10-CM | POA: Diagnosis not present

## 2017-12-08 DIAGNOSIS — G8929 Other chronic pain: Secondary | ICD-10-CM

## 2017-12-08 NOTE — Progress Notes (Addendum)
Office Visit Note   Patient: Julie Warner           Date of Birth: March 14, 1996           MRN: [id number] Visit Date: 12/08/2017              Requested by: Lois Huxley, [address] [place], [place] [address] PCP: Pa, [place]: Visit Diagnoses:  1. Chronic pain of left knee     Plan: We will send her to physical therapy to work on quad strengthening particular VMO.  We will see her back in a month check progress lack of.  She can continue her naproxen knee brace as needed.  She may require MRI of the knee to rule out injury to the medial patella femoral ligament.  Follow-Up Instructions: Return in about 1 month (around 01/05/2018).   Orders:  Orders Placed This Encounter  Procedures  . XR Knee 1-2 Views Left   No orders of the defined types were placed in this encounter.     Procedures: No procedures performed   Clinical Data: No additional findings.   Subjective: Chief Complaint  Patient presents with  . Left Knee - Pain    HPI Julie Warner is a 22 year old female comes in today with a long history of left knee pain.  She states she injured her knee some 5 years ago when playing soccer and went to kick ball twister knee and she had some type of dislocation.  She states she was treated conservatively and did state out of activities years later she reports that she was at a trampoline park and felt like the knee dislocated again.  She is describes that shifting motion of the bones one going medial when going lateral.  She does not relate that her patella comes out but she is quite unsure of what exactly happens whenever she has this gliding slipping motion of the knee.  Last episode was this June.  She notes she has swelling after the episodes.  She is tried naproxen brace.  She said no physical therapy.  She is reluctant to participate in any sports or work on any strengthening of the leg due to the fact that she is afraid that  the knee is going to "slide ".  No mechanical symptoms of the knee otherwise. Review of Systems Please see HPI otherwise negative  Objective: Vital Signs: There were no vitals taken for this visit.  Physical Exam  Constitutional: She is oriented to person, place, and time. She appears well-developed and well-nourished. No distress.  Cardiovascular: Intact distal pulses.  Pulmonary/Chest: Effort normal.  Neurological: She is alert and oriented to person, place, and time.  Skin: She is not diaphoretic.  Psychiatric: She has a normal mood and affect.    Ortho Exam Bilateral knees no effusion abnormal warmth erythema.  Good range of motion of both knees without pain.  Patellas track well.  She does have atrophy of the quad muscles bilaterally however slightly worse on the left.  Negative anterior drawer bilaterally.  Negative Lockman's bilaterally.  No instability valgus varus stressing of either knee.  Freddi Starr test is negative bilaterally.  Apprehension test but bilateral patellas negative. Specialty Comments:  No specialty comments available.  Imaging: Xr Knee 1-2 Views Left  Result Date: 12/09/2017 Left knee 2 views: No acute fractures.  All 3 compartments well-maintained.  No bony abnormalities.    PMFS History: There are  no active problems to display for this patient.  Past Medical History:  Diagnosis Date  . Allergy    seasonal     History reviewed. No pertinent family history.  History reviewed. No pertinent surgical history. Social History   Occupational History  . Not on file  Tobacco Use  . Smoking status: Never Smoker  Substance and Sexual Activity  . Alcohol use: No  . Drug use: No  . Sexual activity: Not on file

## 2017-12-09 ENCOUNTER — Encounter (INDEPENDENT_AMBULATORY_CARE_PROVIDER_SITE_OTHER): Payer: Self-pay | Admitting: Physician Assistant

## 2017-12-09 ENCOUNTER — Other Ambulatory Visit (INDEPENDENT_AMBULATORY_CARE_PROVIDER_SITE_OTHER): Payer: Self-pay

## 2017-12-09 DIAGNOSIS — M25562 Pain in left knee: Principal | ICD-10-CM

## 2017-12-09 DIAGNOSIS — G8929 Other chronic pain: Secondary | ICD-10-CM

## 2017-12-21 MED FILL — SERTRALINE HCL 50 MG TABLET: 50 | 90 days supply | Qty: 90 | Fill #0

## 2017-12-21 MED FILL — LARIN FE 1.5-30 TABLET: 1.5-30 | 84 days supply | Qty: 84 | Fill #3

## 2017-12-28 ENCOUNTER — Other Ambulatory Visit: Payer: Self-pay

## 2017-12-28 ENCOUNTER — Encounter: Payer: Self-pay | Admitting: Physical Therapy

## 2017-12-28 ENCOUNTER — Ambulatory Visit: Payer: No Typology Code available for payment source | Attending: Physician Assistant | Admitting: Physical Therapy

## 2017-12-28 DIAGNOSIS — R262 Difficulty in walking, not elsewhere classified: Secondary | ICD-10-CM | POA: Diagnosis present

## 2017-12-28 DIAGNOSIS — M25562 Pain in left knee: Secondary | ICD-10-CM | POA: Insufficient documentation

## 2017-12-28 DIAGNOSIS — G8929 Other chronic pain: Secondary | ICD-10-CM | POA: Diagnosis present

## 2017-12-28 NOTE — Therapy (Signed)
[place], [place], [address] Phone: [phone]   Fax:  [phone]  Physical Therapy Evaluation  Patient Details  Name: Julie Warner MRN: [id number] Date of Birth: [birth year] Referring Provider: Pete Pelt, PA-C   Encounter Date: 12/28/2017  PT End of Session - 12/28/17 1550    Visit Number  1    Number of Visits  9    Date for PT Re-Evaluation  01/29/18    Authorization Type  [employer] FOCUS    PT Start Time  1546    PT Stop Time  1628    PT Time Calculation (min)  42 min    Activity Tolerance  Patient tolerated treatment well    Behavior During Therapy  [facility] for tasks assessed/performed       Past Medical History:  Diagnosis Date  . Allergy    seasonal     History reviewed. No pertinent surgical history.  There were no vitals filed for this visit.   Subjective Assessment - 12/28/17 1552    Subjective  About 5 yrs ago injured knee playing soccer. planted Lt foot to kick with Rt knee my body twisted, it popped and swelled. Was told by MD at the time it was sprained. About a year later went to trampoline park and injured it in a similar way. ER did xray. It felt like femur and tibia are sliding laterally on eachother. Now has random sensations of knee sliding. When I stepped to the side in June it felt like it did it again. nervous to do strengthening. Went to [employer] and had a lot of swelling which made it hard to move. Occasional popping when walking (pointing to patellar tendon area). Reports knee feels like it is giving out all of the time. Back has been doing ok since last round of PT. No pain right now but feels weakness. Keeping knee bent for more than 5 min is uncomfortable.     How long can you walk comfortably?  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  No/denies         [facility] PT Assessment - 12/28/17 0001       Assessment   Medical Diagnosis  chronic Lt knee pain    Referring Provider  Pete Pelt, PA-C    Hand Dominance  Right      Precautions   Precautions  None      Restrictions   Weight Bearing Restrictions  No      Balance Screen   Has the patient fallen in the past 6 months  Yes    How many times?  multiple    Has the patient had a decrease in activity level because of a fear of falling?   Yes    Is the patient reluctant to leave their home because of a fear of falling?   No      Home Film/video editor residence    Additional Comments  stairs at home      Prior Function   Vocation Requirements  work at [employer]   Overall Cognitive Status  Within Functional Limits for tasks assessed    Area of Impairment  --      Observation/Other Assessments   Focus on Therapeutic Outcomes (FOTO)   48% limited  Sensation   Additional Comments  WFL      ROM / Strength   AROM / PROM / Strength  Strength      Strength   Strength Assessment Site  Hip;Knee    Right/Left Hip  Right;Left    Right Hip Flexion  5/5    Right Hip Extension  5/5    Right Hip ABduction  5/5    Right Hip ADduction  5/5    Left Hip Flexion  5/5    Left Hip Extension  4-/5    Left Hip ABduction  4+/5    Left Hip ADduction  4/5    Right/Left Knee  Right;Left    Right Knee Flexion  5/5    Right Knee Extension  5/5    Left Knee Flexion  5/5    Left Knee Extension  5/5      Palpation   Palpation comment  significant lateral laxity noted      Special Tests    Special Tests  Laxity/Instability Tests    Laxity/Instability   other      Other   comment  LCL laxity                Objective measurements completed on examination: See above findings.              PT Education - 12/28/17 1646    Education Details  anatomy of condition, POC, HEP, exercise form/rationale    Person(s) Educated  Patient;Spouse    Methods   Explanation;Demonstration;Tactile cues;Verbal cues;Handout    Comprehension  Verbalized understanding;Returned demonstration;Verbal cues required;Tactile cues required;Need further instruction          PT Long Term Goals - 12/28/17 1635      PT LONG TERM GOAL #1   Title  Pt will be able to perform straight line jogging without instability/discomfort in knee.     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    Baseline  pain at eval and compensates    Time  4    Period  Weeks    Status  New    Target Date  01/29/18      PT LONG TERM GOAL #4   Title  pt will be able to ambulate without sensation of knee "giving out"    Time  4    Period  Weeks    Status  New    Target Date  01/29/18      PT LONG TERM GOAL #5   Title  FOTO to 33% limited    Baseline  48% limited at eval    Time  4    Period  Weeks    Status  New    Target Date  01/29/18             Plan - 12/28/17 1629    Clinical Impression  Statement  Pt presents to PT with complaints of Lt knee pain that began about 5 years ago when playing soccer. Reports significant bruising and swelling after the incident, only xrays in the past that were negative for fracture. Pt demo good LE strength overall but noted lack of strength in LLE adduction and extension. Significant laxity with concordant discomfort in lateral knee noted with testing. Suspected tearing of LCL during injury many years ago. Instability noted with lateral movements and pain with deep bending. Asked pt to wear knee brace for support. Will challenge gross LE strength and stability 2x/week for 4 weeks in attempt to meet  long term goals.     Clinical Presentation  Stable    Clinical Decision Making  Low    Rehab Potential  Good    PT Frequency  2x / week    PT Duration  4 weeks    PT Treatment/Interventions  ADLs/Self Care Home Management;Cryotherapy;Electrical Stimulation;Iontophoresis 4mg /ml Dexamethasone;Moist Heat;Therapeutic exercise;Therapeutic activities;Functional mobility training;Stair training;Gait training;Ultrasound;Balance training;Neuromuscular re-education;Patient/family education;Manual techniques;Taping;Dry needling;Passive range of motion    PT Next Visit Plan  CKC strength and balance challenges    PT Home Exercise Plan  prone hip ext, sidelying ADD, clam, bridge with ball    Consulted and Agree with Plan of Care  Patient       Patient will benefit from skilled therapeutic intervention in order to improve the following deficits and impairments:  Increased edema, Decreased activity tolerance, Decreased strength, Pain, Increased muscle spasms, Difficulty walking, Decreased balance, Improper body mechanics, Postural dysfunction  Visit Diagnosis: Chronic pain of left knee - Plan: PT plan of care cert/re-cert  Difficulty in walking, not elsewhere classified - Plan: PT plan of care cert/re-cert     Problem List There are no active problems to display for this patient.   Lacheryl Niesen C. Jehiel Koepp PT, DPT 12/28/17 4:49 PM    [facility] [address] [place], [place], [address] Phone: [phone]   Fax:  [phone]  Name: Julie Warner MRN: [id number] Date of Birth: 25-Mar-1996

## 2017-12-30 ENCOUNTER — Encounter: Payer: Self-pay | Admitting: Physical Therapy

## 2017-12-30 ENCOUNTER — Ambulatory Visit: Payer: No Typology Code available for payment source | Admitting: Physical Therapy

## 2017-12-30 DIAGNOSIS — R262 Difficulty in walking, not elsewhere classified: Secondary | ICD-10-CM

## 2017-12-30 DIAGNOSIS — M25562 Pain in left knee: Principal | ICD-10-CM

## 2017-12-30 DIAGNOSIS — G8929 Other chronic pain: Secondary | ICD-10-CM

## 2017-12-30 NOTE — Therapy (Addendum)
[place], [place], [address] Phone: [phone]   Fax:  [phone]  Physical Therapy Treatment  Patient Details  Name: Julie Warner MRN: [id number] Date of Birth: [birth year] Referring Provider: Pete Pelt, PA-C   Encounter Date: 12/30/2017  PT End of Session - 12/30/17 0935    Visit Number  2    Number of Visits  9    Date for PT Re-Evaluation  01/29/18    Authorization Type  [facility] [employer] FOCUS    PT Start Time  0933    PT Stop Time  1025    PT Time Calculation (min)  52 min    Activity Tolerance  Patient tolerated treatment well    Behavior During Therapy  [facility] for tasks assessed/performed       Past Medical History:  Diagnosis Date  . Allergy    seasonal     History reviewed. No pertinent surgical history.  There were no vitals filed for this visit.  Subjective Assessment - 12/30/17 0935    Subjective  A little sore after eval. wearing brace today.     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  Yes    Pain Score  4     Pain Location  Knee    Pain Orientation  Left    Pain Descriptors / Indicators  Sore                       [facility] Adult PT Treatment/Exercise - 12/31/17 0001      Exercises   Exercises  Knee/Hip      Knee/Hip Exercises: Stretches   Passive Hamstring Stretch  Both;30 seconds    Passive Hamstring Stretch Limitations  supine with strap    Gastroc Stretch  Both;2 reps;30 seconds    Gastroc Stretch Limitations  slant board      Knee/Hip Exercises: Aerobic   Nustep  5 min L5 LE only      Knee/Hip Exercises: Standing   Heel Raises  20 reps    Heel Raises Limitations  slow lower    SLS  3x30s      Knee/Hip Exercises: Supine   Short Arc Quad Sets  Left;15 reps    Bridges with [employer]  15 reps      Knee/Hip Exercises: Sidelying   Hip ABduction  Left;15 reps    Hip ABduction Limitations  abd+ hip flexion, knee  extended      Knee/Hip Exercises: Prone   Hip Extension Limitations  attempting with Lt knee flexed- unable todecrease lumbar assistance    Other Prone Exercises  glut sets      Modalities   Modalities  Electrical Stimulation;Cryotherapy      Cryotherapy   Cryotherapy Location  Knee      Electrical Stimulation   Electrical Stimulation Location  Lt knee    Electrical Stimulation Goals  Pain                  PT Long Term Goals - 12/28/17 1635      PT LONG TERM GOAL #1   Title  Pt will be able to perform straight line jogging without instability/discomfort in knee.     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    Baseline  pain at eval and compensates    Time  4    Period  Weeks    Status  New    Target Date  01/29/18      PT LONG TERM GOAL #4   Title  pt will be able to ambulate without sensation of knee "giving out"    Time  4    Period  Weeks    Status  New    Target Date  01/29/18      PT LONG TERM GOAL #5   Title  FOTO to 33% limited    Baseline  48% limited at eval    Time  4    Period  Weeks    Status  New    Target Date  01/29/18            Plan - 12/30/17 1011    Clinical Impression Statement  Discomfort noted in knee with exercises but "not too bad"  Advised that she should expect some soreness with exercises but avoid significant increase in pain. Tends to compensate with Rt LE in exercises with both feet on floor. Difficulty finding glut contraction- reduced hip ext in HEP to prone glut set for now.     PT Treatment/Interventions  ADLs/Self Care Home Management;Cryotherapy;Electrical  Stimulation;Iontophoresis 4mg /ml Dexamethasone;Moist Heat;Therapeutic exercise;Therapeutic activities;Functional mobility training;Stair training;Gait training;Ultrasound;Balance training;Neuromuscular re-education;Patient/family education;Manual techniques;Taping;Dry needling;Passive range of motion    PT Next Visit Plan  CKC strength and balance challenges    PT Home Exercise Plan  prone hip ext, sidelying ADD, clam, bridge with ball    Consulted and Agree with Plan of Care  Patient       Patient will benefit from skilled therapeutic intervention in order to improve the following deficits and impairments:  Increased edema, Decreased activity tolerance, Decreased strength, Pain, Increased muscle spasms, Difficulty walking, Decreased balance, Improper body mechanics, Postural dysfunction  Visit Diagnosis: Chronic pain of left knee  Difficulty in walking, not elsewhere classified     Problem List There are no active problems to display for this patient.   Latrell Potempa C. Dessiree Sze PT, DPT 12/31/17 9:22 AM   [facility] [address] [place], [place], [address] Phone: [phone]   Fax:  [phone]  Name: Julie Warner MRN: [id number] Date of Birth: 07/15/1995

## 2018-01-05 ENCOUNTER — Ambulatory Visit: Payer: No Typology Code available for payment source | Attending: Physician Assistant | Admitting: Physical Therapy

## 2018-01-05 ENCOUNTER — Encounter: Payer: Self-pay | Admitting: Physical Therapy

## 2018-01-05 DIAGNOSIS — M25562 Pain in left knee: Secondary | ICD-10-CM | POA: Diagnosis not present

## 2018-01-05 DIAGNOSIS — M545 Low back pain, unspecified: Secondary | ICD-10-CM

## 2018-01-05 DIAGNOSIS — M6281 Muscle weakness (generalized): Secondary | ICD-10-CM | POA: Diagnosis present

## 2018-01-05 DIAGNOSIS — R262 Difficulty in walking, not elsewhere classified: Secondary | ICD-10-CM

## 2018-01-05 DIAGNOSIS — M62830 Muscle spasm of back: Secondary | ICD-10-CM

## 2018-01-05 DIAGNOSIS — G8929 Other chronic pain: Secondary | ICD-10-CM

## 2018-01-05 NOTE — Patient Instructions (Addendum)
Terminal Knee Extension (Standing)    Facing anchor with right knee slightly bent and tubing just above knee, gently pull knee back straight. Do not overextend knee. Repeat _15___ times per set. Do _2___ sets per session. Do _1-2___ sessions per day.  http://orth.exer.us/667   Copyright  VHI. All rights reserved.   Voncille Lo, PT Certified Exercise Expert for the Aging Adult  01/05/18 4:24 PM Phone: [phone] Fax: [phone]

## 2018-01-05 NOTE — Therapy (Signed)
[place], [place], [address] Phone: [phone]   Fax:  [phone]  Physical Therapy Treatment  Patient Details  Name: Julie Warner MRN: [id number] Date of Birth: 11-15-1995 Referring Provider: Pete Pelt, PA-C   Encounter Date: 01/05/2018  PT End of Session - 01/05/18 1639    Visit Number  3    Number of Visits  9    Date for PT Re-Evaluation  01/29/18    Authorization Type  [facility] [employer] FOCUS    PT Start Time  1546    PT Stop Time  1630    PT Time Calculation (min)  44 min    Activity Tolerance  Patient tolerated treatment well    Behavior During Therapy  [facility] for tasks assessed/performed       Past Medical History:  Diagnosis Date  . Allergy    seasonal     History reviewed. No pertinent surgical history.  There were no vitals filed for this visit.  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 but then Saturday evening I  had neck pain so severe that  I felt I couldnt move.  I was trying to get off the couch.    Patient is accompained by:  --    How long can you walk comfortably?  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  Yes    Pain Score  2     Pain Location  Knee    Pain Orientation  Left    Pain Descriptors / Indicators  Sore    Pain Score  0    Pain Location  Back    Pain Orientation  Right;Left;Lower    Pain Score  5    Pain Location  Neck    Pain Orientation  Right    Pain Descriptors / Indicators  Spasm                       [facility] Adult PT Treatment/Exercise - 01/05/18 1607      Exercises   Exercises  Knee/Hip      Knee/Hip Exercises: Stretches   Passive Hamstring Stretch  Both;30 seconds    Passive Hamstring Stretch Limitations  supine with strap     ITB Stretch  Both;3  reps;30 seconds    ITB Stretch Limitations  explanation of position for  IT b stretch    Gastroc Stretch  Both;2 reps;30 seconds    Gastroc Stretch Limitations  slant board      Knee/Hip Exercises: Aerobic   Nustep  5 min L5 LE only      Knee/Hip Exercises: Standing   Heel Raises  20 reps    Heel Raises Limitations  eccentrc  lowering    SLS  3x30s      Knee/Hip Exercises: Seated   Other Seated Knee/Hip Exercises  Total motion release LAQ on right 15 sec x 4 and , trunk rotation to left x 4 x 15 sec each with ease of pain in neck and knee      Knee/Hip Exercises: Supine   Short Arc Quad Sets  Left;15 reps    Short Arc Quad Sets Limitations  3 #    Bridges with  Ball Squeeze  15 reps   x2     Knee/Hip Exercises: Sidelying   Clams  15 clams right and left             PT Education - 01/05/18 1624    Education Details  added TKE to HEP and discussed Total motion release for overall body movement and pain relief    Person(s) Educated  Patient    Methods  Explanation;Demonstration;Tactile cues;Verbal cues;Handout    Comprehension  Verbalized understanding;Returned demonstration          PT Long Term Goals - 01/05/18 1639      PT LONG TERM GOAL #1   Title  Pt will be able to perform straight line jogging without instability/discomfort in knee.     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    Baseline  Pt comes in with back and neck pain today and remained doing basic knee and movement    Time  4    Period  Weeks    Status  On-going      PT LONG TERM GOAL #4   Title  pt will be able to ambulate without sensation of knee "giving out"    Time  4     Period  Weeks    Status  On-going      PT LONG TERM GOAL #5   Title  FOTO to 33% limited    Baseline  48% limited at eval    Time  4    Period  Weeks    Status  On-going            Plan - 01/05/18 1641    Clinical Impression Statement  Pt returns today with 2/10 pain in left knee and also complaints of back pain after doing knee exercises which Pt states she is no longer experiencing back pain today. She is complaining of neck pain and problems moving neck in right rotation.. Pt was given general movement suggestions and was advised to see MD if she had increasing or continuing neck pain.  Pt begain CKC exercises today with knee.    Rehab Potential  Good    PT Frequency  2x / week    PT Duration  4 weeks    PT Treatment/Interventions  ADLs/Self Care Home Management;Cryotherapy;Electrical Stimulation;Iontophoresis 4mg /ml Dexamethasone;Moist Heat;Therapeutic exercise;Therapeutic activities;Functional mobility training;Stair training;Gait training;Ultrasound;Balance training;Neuromuscular re-education;Patient/family education;Manual techniques;Taping;Dry needling;Passive range of motion    PT Next Visit Plan  CKC strength and balance challenges,     PT Home Exercise Plan  prone hip ext, sidelying ADD, clam, bridge with ball, TKE       Patient will benefit from skilled therapeutic intervention in order to improve the following deficits and impairments:  Increased edema, Decreased activity tolerance, Decreased strength, Pain, Increased muscle spasms, Difficulty walking, Decreased balance, Improper body mechanics, Postural dysfunction  Visit Diagnosis: Chronic pain of left knee  Difficulty in walking,  not elsewhere classified  Chronic bilateral low back pain without sciatica  Muscle spasm of back  Muscle weakness (generalized)     Problem List There are no active problems to display for this patient.  Voncille Lo, PT Certified Exercise Expert for the Aging Adult   01/05/18 4:45 PM Phone: [phone] Fax: [place] [facility] [address] [place], [place], [address] Phone: [phone]   Fax:  [phone]  Name: Julie Warner MRN: [id number] Date of Birth: January 13, 1996

## 2018-01-06 ENCOUNTER — Ambulatory Visit (INDEPENDENT_AMBULATORY_CARE_PROVIDER_SITE_OTHER): Payer: No Typology Code available for payment source | Admitting: Orthopaedic Surgery

## 2018-01-14 ENCOUNTER — Ambulatory Visit: Payer: No Typology Code available for payment source | Admitting: Physical Therapy

## 2018-01-18 ENCOUNTER — Encounter (INDEPENDENT_AMBULATORY_CARE_PROVIDER_SITE_OTHER): Payer: Self-pay | Admitting: Orthopaedic Surgery

## 2018-01-18 ENCOUNTER — Other Ambulatory Visit (INDEPENDENT_AMBULATORY_CARE_PROVIDER_SITE_OTHER): Payer: Self-pay

## 2018-01-18 ENCOUNTER — Encounter: Payer: Self-pay | Admitting: Physical Therapy

## 2018-01-18 ENCOUNTER — Ambulatory Visit: Payer: No Typology Code available for payment source | Admitting: Physical Therapy

## 2018-01-18 ENCOUNTER — Ambulatory Visit (INDEPENDENT_AMBULATORY_CARE_PROVIDER_SITE_OTHER): Payer: No Typology Code available for payment source | Admitting: Orthopaedic Surgery

## 2018-01-18 DIAGNOSIS — G8929 Other chronic pain: Secondary | ICD-10-CM | POA: Insufficient documentation

## 2018-01-18 DIAGNOSIS — M25562 Pain in left knee: Secondary | ICD-10-CM

## 2018-01-18 DIAGNOSIS — R262 Difficulty in walking, not elsewhere classified: Secondary | ICD-10-CM

## 2018-01-18 NOTE — Progress Notes (Signed)
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  She injured her left knee about 6 years ago and had significant swelling in that knee.  At the time she was told she may have a lateral collateral ligament injury.  At this point her knees been hurting her pretty much on a daily basis and activity related.  She has tried and failed all forms of conservative treatment.  We saw her last visit we sent her for physical therapy to strengthen muscles around the knee and she does feel like her knee is stronger however she is still symptomatic in terms of the mechanical symptoms she is having with her knee with locking and catching.  On exam there is no knee effusion.  She hurts in the posterior lateral corner of her knee and stressing this area causes significant pain.  At this point an MRI is definitely warranted to assess the ligaments and cartilage in her knee and special posterior lateral corner given her continued symptoms and the failure of conservative treatment including therapy.  We will see her back once we have the MRI of her left knee.

## 2018-01-18 NOTE — Therapy (Signed)
[place], [place], [address] Phone: [phone]   Fax:  [phone]  Physical Therapy Treatment  Patient Details  Name: Julie Warner MRN: [id number] Date of Birth: 08-24-1995 Referring Provider: Pete Pelt, PA-C   Encounter Date: 01/18/2018  PT End of Session - 01/18/18 1546    Visit Number  4    Number of Visits  9    Date for PT Re-Evaluation  01/29/18    Authorization Type  [facility] [employer] FOCUS    PT Start Time  1545    PT Stop Time  1623    PT Time Calculation (min)  38 min    Activity Tolerance  Patient tolerated treatment well    Behavior During Therapy  [facility] for tasks assessed/performed       Past Medical History:  Diagnosis Date  . Allergy    seasonal     History reviewed. No pertinent surgical history.  There were no vitals filed for this visit.  Subjective Assessment - 01/18/18 1546    Subjective  Knee is a little stiff today. I was a little nervous to do exercises because my back was hurting. Still swelling which ismaking it stiff, feels stronger and fewer incidence of feeling like it is buckling.     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  No/denies                       [facility] Adult PT Treatment/Exercise - 01/18/18 0001      Exercises   Exercises  Knee/Hip      Knee/Hip Exercises: Stretches   Passive Hamstring Stretch  Both;30 seconds    Passive Hamstring Stretch Limitations  seated EOB      Knee/Hip Exercises: Aerobic   Stationary Bike  5 min L3      Knee/Hip Exercises: Standing   SLS  4*30s on therapad    Other Standing Knee Exercises  Rt foot step over hurdle & return: fwd & latera    Other Standing Knee Exercises  monster walks      Knee/Hip Exercises: Seated   Long Arc Quad  Both;10 reps   ball bw knees   Sit to Sand  15 reps;without UE support   red tband around knees     Knee/Hip Exercises: Sidelying   Hip  ADduction  Both;15 reps    Clams  x20 each                  PT Long Term Goals - 01/05/18 1639      PT LONG TERM GOAL #1   Title  Pt will be able to perform straight line jogging without instability/discomfort in knee.     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 Asked pt to discontinue prone hip extension in HEP to protect back.     PT Treatment/Interventions  ADLs/Self Care Home Management;Cryotherapy;Electrical Stimulation;Iontophoresis 4mg /ml Dexamethasone;Moist Heat;Therapeutic exercise;Therapeutic activities;Functional mobility training;Stair training;Gait training;Ultrasound;Balance training;Neuromuscular re-education;Patient/family education;Manual techniques;Taping;Dry needling;Passive range of motion    PT Next Visit Plan  CKC strength and balance challenges, lifting, deep squat- work activities    PT Home Exercise Plan  prone hip ext, sidelying ADD, clam, bridge with ball, TKE; sit<>stand with band    Consulted and Agree with Plan of Care  Patient       Patient will benefit  from skilled therapeutic intervention in order to improve the following deficits and impairments:  Increased edema, Decreased activity tolerance, Decreased strength, Pain, Increased muscle spasms, Difficulty walking, Decreased balance, Improper body mechanics, Postural dysfunction  Visit Diagnosis: Chronic pain of left knee  Difficulty in walking, not elsewhere classified     Problem List Patient Active Problem List   Diagnosis Date Noted  . Chronic pain of left knee 01/18/2018   Arti Trang C. Summit Arroyave PT, DPT 01/18/18 4:25 PM   [facility] [address] [place], [place], [address] Phone: [phone]   Fax:  [phone]  Name: Julie Warner MRN: [id number] Date of Birth: 1996/02/05

## 2018-01-20 ENCOUNTER — Ambulatory Visit: Payer: No Typology Code available for payment source | Admitting: Physical Therapy

## 2018-01-25 ENCOUNTER — Ambulatory Visit: Payer: No Typology Code available for payment source | Admitting: Physical Therapy

## 2018-01-27 ENCOUNTER — Ambulatory Visit: Payer: No Typology Code available for payment source | Admitting: Physical Therapy

## 2018-01-27 ENCOUNTER — Telehealth: Payer: Self-pay | Admitting: Physical Therapy

## 2018-01-27 NOTE — Telephone Encounter (Signed)
Left message on voicemail about missed visit.  Informed patient of date and time of next visit.  Our number was given for her to call if she is unable to attend or if she no longer needs PT.  I asked her to refer to the policy for attendance. Melvenia Needles PTA

## 2018-01-29 ENCOUNTER — Ambulatory Visit
Admission: RE | Admit: 2018-01-29 | Discharge: 2018-01-29 | Disposition: A | Discharge status: Home / Self Care | Payer: No Typology Code available for payment source | Source: Ambulatory Visit | Attending: Orthopaedic Surgery | Admitting: Orthopaedic Surgery

## 2018-01-29 DIAGNOSIS — M25562 Pain in left knee: Principal | ICD-10-CM

## 2018-01-29 DIAGNOSIS — G8929 Other chronic pain: Secondary | ICD-10-CM

## 2018-02-01 ENCOUNTER — Ambulatory Visit (INDEPENDENT_AMBULATORY_CARE_PROVIDER_SITE_OTHER): Payer: No Typology Code available for payment source | Admitting: Orthopaedic Surgery

## 2018-02-01 ENCOUNTER — Ambulatory Visit: Payer: No Typology Code available for payment source | Admitting: Physical Therapy

## 2018-02-01 ENCOUNTER — Encounter (INDEPENDENT_AMBULATORY_CARE_PROVIDER_SITE_OTHER): Payer: Self-pay | Admitting: Orthopaedic Surgery

## 2018-02-01 DIAGNOSIS — M238X2 Other internal derangements of left knee: Secondary | ICD-10-CM

## 2018-02-01 DIAGNOSIS — M25562 Pain in left knee: Secondary | ICD-10-CM | POA: Diagnosis not present

## 2018-02-01 DIAGNOSIS — G8929 Other chronic pain: Secondary | ICD-10-CM | POA: Diagnosis not present

## 2018-02-01 NOTE — Progress Notes (Signed)
The patient is a very pleasant 22 year old female who is returning for follow-up after having an MRI of her left knee.  She is to be an avid soccer player and injured her knee about 6 years ago.  Since then she has problems with feeling like the knee is unstable to her and wants to give out.  We sent her for an MRI because the concern about slight laxity in her left knee comparing her left and right knees.  She is here for return of this today.  She still has the same symptoms as if the knee is unstable to her.  She is very active individual and does work in [employer].  On exam there is no knee effusion of her left or right knee.  Both knees have full range of motion but I do feel that the left knee has more instability on stressing the ACL.  The MRI is interesting in the fact that it does show a partial ACL tear.  The radiologist reads it is just a small partial tear but looking at the films that feels like it may be more significant.  The cartilage in her knee is intact throughout the knee.  There is no evidence of meniscal tear of the medial or lateral meniscus.  The PCL is also intact.  At this point I would like to refer her to my partner Dr. Marlou Sa for further evaluation and treatment.  This is mainly due to her clinical signs and symptoms of instability of the left knee and the MRI findings.  She is interested in talking to him about what her treatment options are as well.

## 2018-02-02 ENCOUNTER — Encounter (INDEPENDENT_AMBULATORY_CARE_PROVIDER_SITE_OTHER): Payer: Self-pay | Admitting: Orthopedic Surgery

## 2018-02-02 ENCOUNTER — Ambulatory Visit (INDEPENDENT_AMBULATORY_CARE_PROVIDER_SITE_OTHER): Payer: No Typology Code available for payment source | Admitting: Orthopedic Surgery

## 2018-02-02 DIAGNOSIS — S83512D Sprain of anterior cruciate ligament of left knee, subsequent encounter: Secondary | ICD-10-CM

## 2018-02-02 NOTE — Progress Notes (Signed)
Office Visit Note   Patient: Julie Warner           Date of Birth: [birth year]           MRN: [id number] Visit Date: 02/02/2018 Requested by: [facility] [place], [place] [address] PCP: [facility]  Subjective: Chief Complaint  Patient presents with  . Left Knee - Pain    HPI: Patient presents for evaluation of left knee.  She injured her left knee playing soccer about 6 years ago.  She reports continued pain weakness and definite episodes of symptomatic instability.  She works at [employer].  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  At home she does have her husband and her mother is also available.  She currently does take birth control medication and she does have a family history positive for DVT on her father's side.  Her father currently takes blood thinners.  She is taking naproxen and muscle relaxer and occasionally antidepressant.              ROS: All systems reviewed are negative as they relate to the chief complaint within the history of present illness.  Patient denies  fevers or chills.   Assessment & Plan: Visit Diagnoses:  1. Rupture of anterior cruciate ligament of left knee, subsequent encounter     Plan: Impression is left knee ACL tear with intact menisci.  She does have laxity on examination today and gives a definite history of symptomatic instability in the left knee.  No posterior lateral rotatory instability is noted.  Plan at this time is left knee ACL reconstruction using hamstring autograft.  She will need ultrasound to rule out DVT in that left leg postop day #7.  She will also use CPM machine.  I will place her either on baby aspirin 1 twice a day or Xarelto in the immediate postop..  I also want her to stop taking birth control during this perioperative period to diminish her chances of having a blood clot.  Follow-Up Instructions: No follow-ups on file.   Orders:  No  orders of the defined types were placed in this encounter.  No orders of the defined types were placed in this encounter.     Procedures: No procedures performed   Clinical Data: No additional findings.  Objective: Vital Signs: There were no vitals taken for this visit.  Physical Exam:   Constitutional: Patient appears well-developed HEENT:  Head: Normocephalic Eyes:EOM are normal Neck: Normal range of motion Cardiovascular: Normal rate Pulmonary/chest: Effort normal Neurologic: Patient is alert Skin: Skin is warm Psychiatric: Patient has normal mood and affect    Ortho Exam: Ortho exam demonstrates normal gait alignment with good quad tendon and hamstring strength bilaterally.  She does have ACL laxity with positive Lachman and anterior drawer on the left but not the right.  There is no joint line tenderness and no collateral ligament instability on the left-hand side.  Ankle dorsiflexion plantarflexion intact.  No varus thrust when she walks.  No posterior lateral rotatory instability is noted.  Specialty Comments:  No specialty comments available.  Imaging: No results found.   PMFS History: Patient Active Problem List   Diagnosis Date Noted  . Chronic pain of left knee 01/18/2018   Past Medical History:  Diagnosis Date  . Allergy    seasonal     History reviewed. No pertinent family history.  History reviewed. No pertinent surgical  history. Social History   Occupational History  . Not on file  Tobacco Use  . Smoking status: Never Smoker  Substance and Sexual Activity  . Alcohol use: No  . Drug use: No  . Sexual activity: Not on file

## 2018-02-03 ENCOUNTER — Ambulatory Visit: Payer: No Typology Code available for payment source | Admitting: Physical Therapy

## 2018-02-04 ENCOUNTER — Other Ambulatory Visit (HOSPITAL_COMMUNITY)
Admission: RE | Admit: 2018-02-04 | Discharge: 2018-02-04 | Disposition: A | Discharge status: Home / Self Care | Payer: No Typology Code available for payment source | Source: Ambulatory Visit | Attending: Family Medicine | Admitting: Family Medicine

## 2018-02-04 ENCOUNTER — Other Ambulatory Visit: Payer: Self-pay | Admitting: Family Medicine

## 2018-02-04 DIAGNOSIS — Z124 Encounter for screening for malignant neoplasm of cervix: Secondary | ICD-10-CM | POA: Diagnosis not present

## 2018-02-08 ENCOUNTER — Telehealth (INDEPENDENT_AMBULATORY_CARE_PROVIDER_SITE_OTHER): Payer: Self-pay | Admitting: Orthopedic Surgery

## 2018-02-08 LAB — CYTOLOGY - PAP: Diagnosis: NEGATIVE

## 2018-02-08 NOTE — Telephone Encounter (Signed)
Ok thx.

## 2018-02-08 NOTE — Telephone Encounter (Signed)
FYI

## 2018-02-08 NOTE — Telephone Encounter (Signed)
Patient has decided that she would prefer to wait until the new year to have left knee surgery (ACL RECON) since she will be doing a lot of traveling over the next few months.  She has my name and direct number for scheduling.

## 2018-02-09 ENCOUNTER — Encounter: Payer: Self-pay | Admitting: Physical Therapy

## 2018-02-09 ENCOUNTER — Ambulatory Visit: Payer: No Typology Code available for payment source | Attending: Physician Assistant | Admitting: Physical Therapy

## 2018-02-09 DIAGNOSIS — M25562 Pain in left knee: Secondary | ICD-10-CM | POA: Insufficient documentation

## 2018-02-09 DIAGNOSIS — G8929 Other chronic pain: Secondary | ICD-10-CM | POA: Diagnosis present

## 2018-02-09 DIAGNOSIS — R262 Difficulty in walking, not elsewhere classified: Secondary | ICD-10-CM | POA: Insufficient documentation

## 2018-02-09 NOTE — Therapy (Signed)
[place], [place], [address] Phone: [phone]   Fax:  [phone]  Physical Therapy Treatment/ERO  Patient Details  Name: Julie Warner MRN: [id number] Date of Birth: [birth year] Referring Provider (PT): Pete Pelt, [place]   Encounter Date: 02/09/2018  PT End of Session - 02/09/18 1547    Visit Number  5    Number of Visits  9    Date for PT Re-Evaluation  02/19/18    Authorization Type  [facility] FOCUS    PT Start Time  1547    PT Stop Time  1627    PT Time Calculation (min)  40 min    Activity Tolerance  Patient tolerated treatment well    Behavior During Therapy  [facility] for tasks assessed/performed       Past Medical History:  Diagnosis Date  . Allergy    seasonal     History reviewed. No pertinent surgical history.  There were no vitals filed for this visit.  Subjective Assessment - 02/09/18 1549    Subjective  Pt reports knee is feeling okay right know. 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  No/denies         [facility] PT Assessment - 02/09/18 0001      Assessment   Medical Diagnosis  chronic Lt knee pain    Referring Provider (PT)  Georgia Lopes                   [facility] Adult PT Treatment/Exercise - 02/09/18 0001      Exercises   Exercises  Knee/Hip      Knee/Hip Exercises: Stretches   Passive Hamstring Stretch Limitations  supine with strap    Hip Flexor Stretch Limitations  prone with strap    Piriformis Stretch Limitations  figure 4    Gastroc Stretch Limitations  slant board & standing      Knee/Hip Exercises: Aerobic   Stepper  4 min L3      Knee/Hip Exercises: Standing   Heel Raises Limitations  single leg    Functional Squat Limitations  chair taps green tband at knees      Knee/Hip Exercises: Seated   Long Arc Quad Limitations  ball bw knees      Knee/Hip  Exercises: Supine   Bridges with Ball Squeeze  5 reps    Single Leg Bridge  Strengthening   ball bw knees, opp extended     Knee/Hip Exercises: Prone   Other Prone Exercises  qped hip ext & abd ball behind knee             PT Education - 02/09/18 1627    Education Details  exercise form/rationale, importance of strength and flexibility pre-surgery    Person(s) Educated  Patient;Spouse    Methods  Explanation;Demonstration;Tactile cues;Verbal cues;Handout    Comprehension  Verbalized understanding;Returned demonstration;Verbal cues required;Tactile cues required;Need further instruction          PT Long Term Goals - 01/05/18 1639      PT LONG TERM GOAL #1   Title  Pt will be able to perform straight line jogging without instability/discomfort in knee.     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 Created home program today for strengthening of LE biomechanical chain. Is considering gym membership and will have one more appointment to create proper gym program. Limited  proprioception and strength noted in Lt leg vs Rt today.     PT Frequency  1x / week    PT Duration  --   1 more week   PT Treatment/Interventions  ADLs/Self Care Home Management;Cryotherapy;Electrical Stimulation;Iontophoresis 4mg /ml Dexamethasone;Moist Heat;Therapeutic exercise;Therapeutic activities;Functional mobility training;Stair training;Gait training;Ultrasound;Balance training;Neuromuscular re-education;Patient/family education;Manual techniques;Taping;Dry needling;Passive range of motion    PT Next Visit Plan  gym program, core    PT Home Exercise Plan  see pt instructions    Consulted and Agree with Plan of Care  Patient       Patient will benefit from skilled therapeutic intervention in order to improve the following deficits and impairments:  Increased edema, Decreased activity tolerance, Decreased strength, Pain, Increased muscle spasms, Difficulty walking, Decreased balance, Improper body mechanics, Postural dysfunction  Visit Diagnosis: Chronic pain of left knee - Plan: PT plan of care cert/re-cert  Difficulty in walking, not elsewhere classified - Plan: PT plan of care cert/re-cert     Problem List Patient Active Problem List   Diagnosis Date Noted  . Chronic pain of left knee 01/18/2018   Torren Maffeo C. Zeah Germano PT, DPT 02/09/18 5:44 PM   [facility] [address] [place], [place], [address] Phone: [phone]   Fax:  [phone]  Name: Julie Warner MRN: [id number] Date of Birth: 1995/09/25

## 2018-02-16 ENCOUNTER — Ambulatory Visit: Payer: No Typology Code available for payment source | Admitting: Physical Therapy

## 2018-02-16 ENCOUNTER — Encounter: Payer: Self-pay | Admitting: Physical Therapy

## 2018-02-16 DIAGNOSIS — R262 Difficulty in walking, not elsewhere classified: Secondary | ICD-10-CM

## 2018-02-16 DIAGNOSIS — G8929 Other chronic pain: Secondary | ICD-10-CM

## 2018-02-16 DIAGNOSIS — M25562 Pain in left knee: Principal | ICD-10-CM

## 2018-02-16 NOTE — Therapy (Signed)
[place], [place], [address] Phone: [phone]   Fax:  [phone]  Physical Therapy Treatment/Discharge  Patient Details  Name: Julie Warner MRN: [id number] Date of Birth: 12-Feb-1996 Referring Provider (PT): Pete Pelt, [place]   Encounter Date: 02/16/2018  PT End of Session - 02/16/18 1622    Visit Number  6    Number of Visits  9    Date for PT Re-Evaluation  02/19/18    Authorization Type  [facility] FOCUS    PT Start Time  1545    PT Stop Time  1623    PT Time Calculation (min)  38 min    Activity Tolerance  Patient tolerated treatment well    Behavior During Therapy  [facility] for tasks assessed/performed       Past Medical History:  Diagnosis Date  . Allergy    seasonal     History reviewed. No pertinent surgical history.  There were no vitals filed for this visit.  Subjective Assessment - 02/16/18 1553    Subjective  Pt reports that MD stated left ACL 90% torn.  wearing brace at all times. ACL repair with hamstring repair graft in new year    How long can you walk comfortably?  only about an hour    Currently in Pain?  Yes    Pain Score  1     Pain Location  Knee    Pain Orientation  Left    Pain Descriptors / Indicators  Sore         [facility] PT Assessment - 02/16/18 0001      Assessment   Medical Diagnosis  chronic Lt knee pain    Referring Provider (PT)  Pete Pelt, PA-C      Observation/Other Assessments   Focus on Therapeutic Outcomes (FOTO)   48% limited      Strength   Right Hip Flexion  5/5    Right Hip Extension  5/5    Right Hip ABduction  5/5    Right Hip ADduction  5/5    Left Hip Flexion  5/5    Left Hip Extension  5/5    Left Hip ABduction  5/5    Left Hip ADduction  5/5    Right Knee Flexion  5/5    Right Knee Extension  5/5    Left Knee Flexion  5/5    Left Knee Extension  5/5                   [facility] Adult PT Treatment/Exercise - 02/16/18  0001      Exercises   Exercises  Other Exercises    Other Exercises   gym machinery & options for gym exercises             PT Education - 02/16/18 1630    Education Details  goals discussion, FOTO, gym program    Person(s) Educated  Patient;Spouse    Methods  Explanation;Demonstration;Tactile cues;Verbal cues    Comprehension  Verbalized understanding;Returned demonstration;Verbal cues required;Tactile cues required          PT Long Term Goals - 02/16/18 1600      PT LONG TERM GOAL #1   Title  Pt will be able to perform straight line jogging without instability/discomfort in knee.     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    Baseline  leans to Rt to stay off of Left leg, reports she is able to complete daily tasks at work    Status  Partially Met      PT LONG TERM GOAL #4   Title  pt will be able to ambulate without sensation of knee "giving out"    Baseline  havent felt it in a while, only a little weakness with extensive motions    Status  Achieved      PT LONG TERM GOAL #5   Title  FOTO to 33% limited    Baseline  48% limited    Status  Not Met            Plan - 02/16/18 1628    Clinical Impression Statement  Exercises today focused on formation of gym program for independent strengthening until her ACL repair. Pt has gained significant strength along biomechanical chain and verbalized readiness for d/c to indepedent program. Was encouraged to contact us with any further questions.     PT Treatment/Interventions  ADLs/Self Care Home Management;Cryotherapy;Electrical Stimulation;Iontophoresis 12m/ml Dexamethasone;Moist Heat;Therapeutic exercise;Therapeutic activities;Functional mobility training;Stair  training;Gait training;Ultrasound;Balance training;Neuromuscular re-education;Patient/family education;Manual techniques;Taping;Dry needling;Passive range of motion    PT Home Exercise Plan  see pt instructions    Consulted and Agree with Plan of Care  Patient       Patient will benefit from skilled therapeutic intervention in order to improve the following deficits and impairments:  Increased edema, Decreased activity tolerance, Decreased strength, Pain, Increased muscle spasms, Difficulty walking, Decreased balance, Improper body mechanics, Postural dysfunction  Visit Diagnosis: Chronic pain of left knee  Difficulty in walking, not elsewhere classified     Problem List Patient Active Problem List   Diagnosis Date Noted  . Chronic pain of left knee 01/18/2018    PHYSICAL THERAPY DISCHARGE SUMMARY  Visits from Start of Care: 6  Current functional level related to goals / functional outcomes: See above   Remaining deficits: See above   Education / Equipment: Anatomy of condition, POC, HEP, exercise form/rationale  Plan: Patient agrees to discharge.  Patient goals were partially met. Patient is being discharged due to being pleased with the current functional level.  ?????     Shaquoia Miers C. Jullian Clayson PT, DPT 02/16/18 4:31 PM   [facility]118 [address]GStephan [place] [address]Phone: [phone]  Fax:  [phone] Name: Julie NewcombeMRN: [id number] of Birth: 4Aug 07, 1997

## 2018-02-23 ENCOUNTER — Encounter: Payer: No Typology Code available for payment source | Admitting: Physical Therapy

## 2018-02-25 ENCOUNTER — Encounter: Payer: No Typology Code available for payment source | Admitting: Physical Therapy

## 2018-03-03 MED FILL — ALPRAZolam 0.25 MG TABS: 0.25 | 7 days supply | Qty: 15 | Fill #0

## 2018-03-04 MED FILL — LARIN FE 1.5-30 TABLET: 1.5-30 | 84 days supply | Qty: 84 | Fill #0

## 2018-03-08 MED FILL — SERTRALINE HCL 50 MG TABLET: 50 | 90 days supply | Qty: 90 | Fill #1

## 2018-05-25 DIAGNOSIS — J0100 Acute maxillary sinusitis, unspecified: Secondary | ICD-10-CM | POA: Diagnosis not present

## 2018-05-25 MED FILL — PROMETHAZINE W/DM SYRUP: 6.25-15 | 6 days supply | Qty: 120 | Fill #0

## 2018-05-25 MED FILL — AMOXICILLIN 875 MG TABLET: 875 | 10 days supply | Qty: 20 | Fill #0

## 2018-06-02 DIAGNOSIS — J019 Acute sinusitis, unspecified: Secondary | ICD-10-CM | POA: Diagnosis not present

## 2018-06-02 MED FILL — LARIN FE 1.5-30 TABLET: 1.5-30 | 84 days supply | Qty: 84 | Fill #1

## 2018-07-05 ENCOUNTER — Telehealth (INDEPENDENT_AMBULATORY_CARE_PROVIDER_SITE_OTHER): Payer: Self-pay | Admitting: Orthopedic Surgery

## 2018-07-05 NOTE — Telephone Encounter (Signed)
Patient is scheduled for left knee ACL reconstruction on 08-10-18 @[facility] . Post op set for 08-18-18.  [facility] contacted.  CPM order faxed to [facility].

## 2018-07-05 NOTE — Telephone Encounter (Signed)
thx

## 2018-08-02 ENCOUNTER — Other Ambulatory Visit (HOSPITAL_COMMUNITY): Payer: No Typology Code available for payment source

## 2018-08-10 ENCOUNTER — Ambulatory Visit: Admit: 2018-08-10 | Payer: No Typology Code available for payment source | Admitting: Orthopedic Surgery

## 2018-08-10 SURGERY — RECONSTRUCTION, KNEE, ACL
Anesthesia: General | Laterality: Left

## 2018-08-18 ENCOUNTER — Inpatient Hospital Stay (INDEPENDENT_AMBULATORY_CARE_PROVIDER_SITE_OTHER): Payer: No Typology Code available for payment source | Admitting: Orthopedic Surgery

## 2018-08-19 MED FILL — LARIN FE 1.5-30 TABLET: 1.5-30 | 84 days supply | Qty: 84 | Fill #0

## 2018-09-08 DIAGNOSIS — F419 Anxiety disorder, unspecified: Secondary | ICD-10-CM | POA: Diagnosis not present

## 2018-09-08 DIAGNOSIS — F321 Major depressive disorder, single episode, moderate: Secondary | ICD-10-CM | POA: Diagnosis not present

## 2018-09-08 DIAGNOSIS — R42 Dizziness and giddiness: Secondary | ICD-10-CM | POA: Diagnosis not present

## 2018-09-08 DIAGNOSIS — G43909 Migraine, unspecified, not intractable, without status migrainosus: Secondary | ICD-10-CM | POA: Diagnosis not present

## 2018-09-08 MED FILL — SERTRALINE HCL 50 MG TABLET: 50 | 90 days supply | Qty: 90 | Fill #0

## 2018-10-11 DIAGNOSIS — M26621 Arthralgia of right temporomandibular joint: Secondary | ICD-10-CM | POA: Diagnosis not present

## 2018-10-11 DIAGNOSIS — H93293 Other abnormal auditory perceptions, bilateral: Secondary | ICD-10-CM | POA: Diagnosis not present

## 2018-10-11 DIAGNOSIS — R42 Dizziness and giddiness: Secondary | ICD-10-CM | POA: Diagnosis not present

## 2018-10-11 DIAGNOSIS — J343 Hypertrophy of nasal turbinates: Secondary | ICD-10-CM | POA: Diagnosis not present

## 2018-10-11 DIAGNOSIS — H9201 Otalgia, right ear: Secondary | ICD-10-CM | POA: Diagnosis not present

## 2018-10-11 DIAGNOSIS — H9311 Tinnitus, right ear: Secondary | ICD-10-CM | POA: Diagnosis not present

## 2018-10-11 MED FILL — IBUPROFEN 600 MG TABLET: 600 | 14 days supply | Qty: 42 | Fill #0

## 2018-10-11 NOTE — Progress Notes (Signed)
 Otolaryngology Clinic Note  HPI:    Julie Warner is a 24 y.o. female who presents as a new patient with a chief complaint of PA Becker.  Her ears have been hypersensitive for a long time.  She had a double ear infection with sinusitis and the flu over a year ago.  Since then, she has had more pain that can occur with loud sounds or randomly.  More recently, she has felt some dizziness with nausea with prolonged music listening, video game playing, or even reading.  It feels like her head spins about once or twice per month.  More often, she feels light-headed.  Sometimes, it feels like a bubble around her head like her ears need to pop.  Auto-insufflation does not help.  A couple of weeks ago, she felt a stabbing feeling in the right ear that lasted 15 minutes.  Throbbing has been worse in both ears since then.  PMH/Meds/All/SocHx/FamHx/ROS:   Past Medical History:  Diagnosis Date  . Anxiety   . TIA (transient ischemic attack)     History reviewed. No pertinent surgical history.  No family history of bleeding disorders, wound healing problems or difficulty with anesthesia.   Social History   Socioeconomic History  . Marital status: Unknown    Spouse name: Not on file  . Number of children: Not on file  . Years of education: Not on file  . Highest education level: Not on file  Occupational History  . Not on file  Social Needs  . Financial resource strain: Not on file  . Food insecurity    Worry: Not on file    Inability: Not on file  . Transportation needs    Medical: Not on file    Non-medical: Not on file  Tobacco Use  . Smoking status: Never Smoker  . Smokeless tobacco: Never Used  Substance and Sexual Activity  . Alcohol use: Never    Frequency: Never  . Drug use: Not on file  . Sexual activity: Not on file  Lifestyle  . Physical activity    Days per week: Not on file    Minutes per session: Not on file  . Stress: Not on file  Relationships  . Social Wellsite geologist on phone: Not on file    Gets together: Not on file    Attends religious service: Not on file    Active member of club or organization: Not on file    Attends meetings of clubs or organizations: Not on file    Relationship status: Not on file  Other Topics Concern  . Not on file  Social History Narrative  . Not on file     Current Outpatient Medications:  .  ALPRAZolam  (XANAX ) 0.25 MG tablet, Take 0.25 mg by mouth., Disp: , Rfl:  .  norethindrone-ethinyl estradiol-iron  (MICROGESTIN FE1.5/30) 1.5 mg-30 mcg (21)/75 mg (7) tablet, Take by mouth., Disp: , Rfl:  .  sertraline  (ZOLOFT ) 50 MG tablet,  , Disp: , Rfl:   A complete ROS was performed with pertinent positives/negatives noted in the HPI. The remainder of the ROS are negative.    Physical Exam:    BP 114/66   Ht 1.524 m (5')   Wt 63.5 kg (140 lb)   BMI 27.34 kg/m   Appearance: alert, NAD, pleasant and cooperative Ability to communicate: normal voice Left ear: external ear normal, external auditory canal without substantial cerumen, tympanic membrane intact, middle ear aerated Right ear: external ear normal,  external auditory canal without substantial cerumen, tympanic membrane intact, middle ear aerated Hearing: understands normal conversational speech TMJ: right with crepitance with opening, mild tenderness Nose: external nose normal, septum relatively midline, mild inferior turbinate hypertrophy; no lesions, masses, polyps or exudates  Lips, teeth, gums: no lesions, good dentition, healthy gums Oropharynx: mucosa without ulcers, masses or leukoplakia, normal tongue, tonsils 1+ Neck: no mass or tenderness, normal neck landmarks Thyroid : no thyromegaly or mass Lymphatics: no cervical adenopathy Salivary glands: soft, symmetric, no masses Facial strength: normal and symmetric CN II-XII intact  Independent Review of Additional Tests or Records:  Pure tone audiometry reveals normal hearing in both ears.  Speech  discrimination is normal bilaterally.  Procedures:  None  Impression & Plans:  Julie Warner is a 23 y.o. female with right referred otalgia from TMJ pain.  - It is hard to make sense of all of her symptoms but it does seem that her ear pain and pressure are due to TMJ pain.  The ear is normal on exam.  I recommended two weeks of anti-inflammatory strength ibuprofen , avoidance of chewing, and frequent use of heat.  She was invited to call back if symptoms do not improve or if some symptoms continue.  If problems recur in the future, she was advised to discuss the problem further with his dentist.  Vaughan Ricker, MD Otolaryngology    Electronically signed by: Vaughan Alm Ricker, MD 10/11/18 [phone]

## 2018-10-21 MED FILL — IBUPROFEN 600 MG TABLET: 600 | 14 days supply | Qty: 42 | Fill #0

## 2018-11-08 MED FILL — ALPRAZolam 0.25 MG TABS: 0.25 | 15 days supply | Qty: 30 | Fill #0

## 2018-11-17 MED FILL — LARIN FE 1.5-30 TABLET: 1.5-30 | 84 days supply | Qty: 84 | Fill #0

## 2018-11-23 NOTE — Pre-Procedure Instructions (Signed)
Julie Warner  11/23/2018    Your procedure is scheduled on Tuesday, July 28.  Report to [facility], Main Entrance or Entrance "A" at 12:50 P.M.                 Your surgery or procedure is scheduled for 2:50 PM   Call this number if you have problems the morning of surgery: [phone]  This is the number for the Pre- Surgical Desk.               For any other questions, please call [phone], Monday - Friday 8 AM - 4 PM.   Remember:  Do not eat after midnight Monday, July 27.  You may drink clear liquids until 11:50 AM .  Clear liquids allowed are:  Water, Juice (non-citric and without pulp), Carbonated beverages, Clear Tea, Black Coffee only, Plain Jell-O only, Gatorade and Plain Popsicles only    Drink the Ensure Pre- Surgery between 11: 20 AM and 11:50 AM                  Nothing to drink after 11:50 AM   Take these medicines the morning of surgery with A SIP OF WATER:   Birth Control Pill              sertraline (ZOLOFT)                If needed: ALPRAZolam Julie Warner)   STOP/ Do Not Start taking Aspirin, Aspirin Products (Goody Powder, Excedrin Migraine), Ibuprofen (Advil), Naproxen (Aleve), Vitamins and Herbal Products (ie Fish Oil).  Special instructions:   [facility]- Preparing For Surgery  Before surgery, you can play an important role. Because skin is not sterile, your skin needs to be as free of germs as possible. You can reduce the number of germs on your skin by washing with CHG (chlorahexidine gluconate) Soap before surgery.  CHG is an antiseptic cleaner which kills germs and bonds with the skin to continue killing germs even after washing.    Oral Hygiene is also important to reduce your risk of infection.  Remember - BRUSH YOUR TEETH THE MORNING OF SURGERY WITH YOUR REGULAR TOOTHPASTE  Please do not use if you have an allergy to CHG or antibacterial soaps. If your skin becomes reddened/irritated stop using the CHG.  Do not shave (including legs and  underarms) for at least 48 hours prior to first CHG shower. It is OK to shave your face.  Please follow these instructions carefully.   1. Shower the NIGHT BEFORE SURGERY and the MORNING OF SURGERY with CHG.   2. If you chose to wash your hair, wash your hair first as usual with your normal shampoo.  3. After you shampoo, wash your face and private area with the soap you use at home, then rinse your hair and body thoroughly to remove the shampoo and soap..  4. Use CHG as you would any other liquid soap. You can apply CHG directly to the skin and wash gently with a scrungie or a clean washcloth.   5. Apply the CHG Soap to your body ONLY FROM THE NECK DOWN.  Do not use on open wounds or open sores. Avoid contact with your eyes, ears, mouth and genitals (private parts).   6. Wash thoroughly, paying special attention to the area where your surgery will be performed.  7. Thoroughly rinse your body with warm water from the neck down.  8. DO NOT  shower/wash with your normal soap after using and rinsing off the CHG Soap.  9. Pat yourself dry with a CLEAN TOWEL.  10. Wear CLEAN PAJAMAS to bed the night before surgery, wear comfortable clothes the morning of surgery  11. Place CLEAN SHEETS on your bed the night of your first shower and DO NOT SLEEP WITH PETS.  Day of Surgery: Shower as instructed above. Do not wear lotions, powders, or perfumes, or deodorant. Please wear clean clothes to the hospital/surgery center.   Remember to brush your teeth WITH YOUR REGULAR TOOTHPASTE.  Do not wear jewelry, make-up or nail polish.  Do not shave 48 hours prior to surgery.  Men may shave face and neck.  Do not bring valuables to the hospital.  [facility] is not responsible for any belongings or valuables.  Contacts, dentures or bridgework may not be worn into surgery.  Leave your suitcase in the car.  After surgery it may be brought to your room.  For patients admitted to the hospital, discharge  time will be determined by your treatment team.  Patients discharged the day of surgery will not be allowed to drive home.   Please read over the following fact sheets that you were given.

## 2018-11-24 ENCOUNTER — Other Ambulatory Visit: Payer: Self-pay

## 2018-11-24 ENCOUNTER — Encounter (HOSPITAL_COMMUNITY): Payer: Self-pay

## 2018-11-24 ENCOUNTER — Encounter (HOSPITAL_COMMUNITY)
Admission: RE | Admit: 2018-11-24 | Discharge: 2018-11-24 | Disposition: A | Discharge status: Home / Self Care | Payer: 59 | Source: Ambulatory Visit | Attending: Orthopedic Surgery | Admitting: Orthopedic Surgery

## 2018-11-24 DIAGNOSIS — Z01812 Encounter for preprocedural laboratory examination: Secondary | ICD-10-CM | POA: Insufficient documentation

## 2018-11-24 HISTORY — DX: Anxiety disorder, unspecified: F41.9

## 2018-11-24 HISTORY — DX: Depression, unspecified: F32.A

## 2018-11-24 LAB — CBC
HCT: 40 % (ref 36.0–46.0)
Hemoglobin: 13.4 g/dL (ref 12.0–15.0)
MCH: 31.5 pg (ref 26.0–34.0)
MCHC: 33.5 g/dL (ref 30.0–36.0)
MCV: 93.9 fL (ref 80.0–100.0)
Platelets: 296 10*3/uL (ref 150–400)
RBC: 4.26 MIL/uL (ref 3.87–5.11)
RDW: 11.9 % (ref 11.5–15.5)
WBC: 6.5 10*3/uL (ref 4.0–10.5)
nRBC: 0 % (ref 0.0–0.2)

## 2018-11-24 LAB — BASIC METABOLIC PANEL
Anion gap: 9 (ref 5–15)
BUN: 9 mg/dL (ref 6–20)
CO2: 21 mmol/L — ABNORMAL LOW (ref 22–32)
Calcium: 9.3 mg/dL (ref 8.9–10.3)
Chloride: 106 mmol/L (ref 98–111)
Creatinine, Ser: 0.81 mg/dL (ref 0.44–1.00)
GFR calc Af Amer: >60 mL/min (ref >60)
GFR calc non Af Amer: >60 mL/min (ref >60)
Glucose, Bld: 89 mg/dL (ref 70–99)
Potassium: 4.7 mmol/L (ref 3.5–5.1)
Sodium: 136 mmol/L (ref 135–145)

## 2018-11-24 NOTE — Progress Notes (Addendum)
PCP - [facility] Cardiologist - denes  Chest x-ray - denies EKG - denies  Stress Test - denies  ECHO - denies  Cardiac Cath - denies  Sleep Study - denies CPAP - na  Fasting Blood Sugar - n/a Checks Blood Sugar _____ times a day-n/a  Blood Thinner Instructions: n/a Aspirin Instructions: n/a  Anesthesia review: No  Patient denies shortness of breath, fever, cough and chest pain at PAT appointment  Patient verbalized understanding of instructions that were given to them at the PAT appointment. Patient was also instructed that they will need to review over the PAT instructions again at home before surgery.

## 2018-11-26 ENCOUNTER — Other Ambulatory Visit (HOSPITAL_COMMUNITY): Payer: No Typology Code available for payment source

## 2018-11-27 ENCOUNTER — Other Ambulatory Visit (HOSPITAL_COMMUNITY)
Admission: RE | Admit: 2018-11-27 | Discharge: 2018-11-27 | Disposition: A | Discharge status: Home / Self Care | Payer: 59 | Source: Ambulatory Visit | Attending: Orthopedic Surgery | Admitting: Orthopedic Surgery

## 2018-11-27 DIAGNOSIS — Z01812 Encounter for preprocedural laboratory examination: Secondary | ICD-10-CM | POA: Diagnosis not present

## 2018-11-27 LAB — SARS CORONAVIRUS 2 (TAT 6-24 HRS): SARS Coronavirus 2: NEGATIVE

## 2018-11-30 ENCOUNTER — Ambulatory Visit (HOSPITAL_COMMUNITY)
Admission: RE | Admit: 2018-11-30 | Discharge: 2018-11-30 | Disposition: A | Discharge status: Home / Self Care | Payer: 59 | Attending: Orthopedic Surgery | Admitting: Orthopedic Surgery

## 2018-11-30 ENCOUNTER — Ambulatory Visit (HOSPITAL_COMMUNITY): Payer: 59 | Admitting: Registered Nurse

## 2018-11-30 ENCOUNTER — Encounter (HOSPITAL_COMMUNITY)
Admission: RE | Disposition: A | Discharge status: Home / Self Care | Payer: Self-pay | Source: Home / Self Care | Attending: Orthopedic Surgery

## 2018-11-30 ENCOUNTER — Ambulatory Visit (HOSPITAL_COMMUNITY): Payer: 59 | Admitting: Physician Assistant

## 2018-11-30 ENCOUNTER — Other Ambulatory Visit: Payer: Self-pay

## 2018-11-30 ENCOUNTER — Encounter (HOSPITAL_COMMUNITY): Payer: Self-pay

## 2018-11-30 DIAGNOSIS — F419 Anxiety disorder, unspecified: Secondary | ICD-10-CM | POA: Diagnosis not present

## 2018-11-30 DIAGNOSIS — Y9366 Activity, soccer: Secondary | ICD-10-CM | POA: Diagnosis not present

## 2018-11-30 DIAGNOSIS — F329 Major depressive disorder, single episode, unspecified: Secondary | ICD-10-CM | POA: Insufficient documentation

## 2018-11-30 DIAGNOSIS — Z888 Allergy status to other drugs, medicaments and biological substances status: Secondary | ICD-10-CM | POA: Diagnosis not present

## 2018-11-30 DIAGNOSIS — S83512A Sprain of anterior cruciate ligament of left knee, initial encounter: Secondary | ICD-10-CM | POA: Insufficient documentation

## 2018-11-30 DIAGNOSIS — F418 Other specified anxiety disorders: Secondary | ICD-10-CM | POA: Diagnosis not present

## 2018-11-30 DIAGNOSIS — G8918 Other acute postprocedural pain: Secondary | ICD-10-CM | POA: Diagnosis not present

## 2018-11-30 DIAGNOSIS — S83512D Sprain of anterior cruciate ligament of left knee, subsequent encounter: Secondary | ICD-10-CM

## 2018-11-30 HISTORY — PX: ANTERIOR CRUCIATE LIGAMENT REPAIR: SHX115

## 2018-11-30 SURGERY — RECONSTRUCTION, KNEE, ACL
Anesthesia: Regional | Site: Knee | Laterality: Left

## 2018-11-30 MED ORDER — PROPOFOL 10 MG/ML IV BOLUS
INTRAVENOUS | Status: AC
  Filled 2018-11-30: qty 20

## 2018-11-30 MED ORDER — BUPIVACAINE HCL (PF) 0.25 % IJ SOLN: INTRAMUSCULAR | Status: DC | PRN

## 2018-11-30 MED ORDER — TRANEXAMIC ACID-NACL 1000-0.7 MG/100ML-% IV SOLN
INTRAVENOUS | Status: AC
  Filled 2018-11-30: qty 100

## 2018-11-30 MED ORDER — ACETAMINOPHEN 500 MG PO TABS
1000.0000 mg | ORAL_TABLET | Freq: Once | ORAL | Status: AC
  Administered 2018-11-30: 1000 mg via ORAL

## 2018-11-30 MED ORDER — BUPIVACAINE HCL (PF) 0.5 % IJ SOLN
INTRAMUSCULAR | Status: DC | PRN
  Administered 2018-11-30: 30 mL

## 2018-11-30 MED ORDER — PROPOFOL 500 MG/50ML IV EMUL
INTRAVENOUS | Status: DC | PRN
  Administered 2018-11-30: 30 ug/kg/min via INTRAVENOUS

## 2018-11-30 MED ORDER — METHOCARBAMOL 500 MG PO TABS: 500.0000 mg | ORAL_TABLET | Freq: Three times a day (TID) | ORAL | 0 refills | Status: DC | PRN

## 2018-11-30 MED ORDER — CLONIDINE HCL (ANALGESIA) 100 MCG/ML EP SOLN
EPIDURAL | Status: DC | PRN
  Administered 2018-11-30: 1 mL via INTRA_ARTICULAR

## 2018-11-30 MED ORDER — MIDAZOLAM HCL 2 MG/2ML IJ SOLN
2.0000 mg | Freq: Once | INTRAMUSCULAR | Status: AC
  Administered 2018-11-30: 2 mg via INTRAVENOUS

## 2018-11-30 MED ORDER — ONDANSETRON HCL 4 MG/2ML IJ SOLN
INTRAMUSCULAR | Status: DC | PRN
  Administered 2018-11-30: 4 mg via INTRAVENOUS

## 2018-11-30 MED ORDER — BUPIVACAINE HCL (PF) 0.5 % IJ SOLN
INTRAMUSCULAR | Status: AC
  Filled 2018-11-30: qty 30

## 2018-11-30 MED ORDER — CLONIDINE HCL (ANALGESIA) 100 MCG/ML EP SOLN
EPIDURAL | Status: DC | PRN
  Administered 2018-11-30: 100 ug

## 2018-11-30 MED ORDER — RIVAROXABAN 10 MG PO TABS: 10.0000 mg | ORAL_TABLET | Freq: Every day | ORAL | 0 refills | Status: DC

## 2018-11-30 MED ORDER — EPHEDRINE SULFATE 50 MG/ML IJ SOLN
INTRAMUSCULAR | Status: DC | PRN
  Administered 2018-11-30: 5 mg via INTRAVENOUS
  Administered 2018-11-30: 10 mg via INTRAVENOUS

## 2018-11-30 MED ORDER — BUPIVACAINE-EPINEPHRINE (PF) 0.25% -1:200000 IJ SOLN
INTRAMUSCULAR | Status: AC
  Filled 2018-11-30: qty 10

## 2018-11-30 MED ORDER — MIDAZOLAM HCL 2 MG/2ML IJ SOLN
INTRAMUSCULAR | Status: AC
  Administered 2018-11-30: 2 mg via INTRAVENOUS
  Filled 2018-11-30: qty 2

## 2018-11-30 MED ORDER — CEFAZOLIN SODIUM-DEXTROSE 1-4 GM/50ML-% IV SOLN
INTRAVENOUS | Status: AC
  Filled 2018-11-30: qty 50

## 2018-11-30 MED ORDER — OXYCODONE HCL 5 MG/5ML PO SOLN: 5.0000 mg | Freq: Once | ORAL | Status: DC | PRN

## 2018-11-30 MED ORDER — TRANEXAMIC ACID-NACL 1000-0.7 MG/100ML-% IV SOLN: 1000.0000 mg | INTRAVENOUS | Status: DC

## 2018-11-30 MED ORDER — SODIUM CHLORIDE 0.9 % IR SOLN
Status: DC | PRN
  Administered 2018-11-30: 9000 mL
  Administered 2018-11-30 (×3): 3000 mL
  Administered 2018-11-30: 6000 mL
  Administered 2018-11-30: 3000 mL

## 2018-11-30 MED ORDER — PROMETHAZINE HCL 25 MG/ML IJ SOLN: 6.2500 mg | INTRAMUSCULAR | Status: DC | PRN

## 2018-11-30 MED ORDER — MORPHINE SULFATE (PF) 4 MG/ML IV SOLN
INTRAVENOUS | Status: DC | PRN
  Administered 2018-11-30: 8 mg via INTRAVENOUS

## 2018-11-30 MED ORDER — SODIUM CHLORIDE 0.9 % IV SOLN
INTRAVENOUS | Status: DC | PRN
  Administered 2018-11-30: 17:00:00 10 ug/min via INTRAVENOUS

## 2018-11-30 MED ORDER — OXYCODONE HCL 5 MG PO TABS: 5.0000 mg | ORAL_TABLET | ORAL | 0 refills | Status: DC | PRN

## 2018-11-30 MED ORDER — PROPOFOL 10 MG/ML IV BOLUS
INTRAVENOUS | Status: DC | PRN
  Administered 2018-11-30: 200 mg via INTRAVENOUS
  Administered 2018-11-30: 100 mg via INTRAVENOUS
  Administered 2018-11-30: 20 mg via INTRAVENOUS
  Administered 2018-11-30: 50 mg via INTRAVENOUS

## 2018-11-30 MED ORDER — OXYCODONE HCL 5 MG PO TABS: 5.0000 mg | ORAL_TABLET | Freq: Once | ORAL | Status: DC | PRN

## 2018-11-30 MED ORDER — DEXAMETHASONE SODIUM PHOSPHATE 10 MG/ML IJ SOLN
INTRAMUSCULAR | Status: DC | PRN
  Administered 2018-11-30: 10 mg via INTRAVENOUS

## 2018-11-30 MED ORDER — LACTATED RINGERS IV SOLN
INTRAVENOUS | Status: DC
  Administered 2018-11-30 (×2): via INTRAVENOUS

## 2018-11-30 MED ORDER — MIDAZOLAM HCL 5 MG/5ML IJ SOLN: INTRAMUSCULAR | Status: DC | PRN

## 2018-11-30 MED ORDER — BUPIVACAINE-EPINEPHRINE 0.25% -1:200000 IJ SOLN
INTRAMUSCULAR | Status: DC | PRN
  Administered 2018-11-30: 10 mL

## 2018-11-30 MED ORDER — LIDOCAINE 2% (20 MG/ML) 5 ML SYRINGE
INTRAMUSCULAR | Status: DC | PRN
  Administered 2018-11-30: 50 mg via INTRAVENOUS

## 2018-11-30 MED ORDER — MIDAZOLAM HCL 2 MG/2ML IJ SOLN
INTRAMUSCULAR | Status: AC
  Filled 2018-11-30: qty 2

## 2018-11-30 MED ORDER — CEFAZOLIN SODIUM-DEXTROSE 2-4 GM/100ML-% IV SOLN
2.0000 g | INTRAVENOUS | Status: AC
  Administered 2018-11-30: 2 g via INTRAVENOUS

## 2018-11-30 MED ORDER — ACETAMINOPHEN 500 MG PO TABS
ORAL_TABLET | ORAL | Status: AC
  Filled 2018-11-30: qty 2

## 2018-11-30 MED ORDER — CHLORHEXIDINE GLUCONATE 4 % EX LIQD: 60.0000 mL | Freq: Once | CUTANEOUS | Status: DC

## 2018-11-30 MED ORDER — EPINEPHRINE PF 1 MG/ML IJ SOLN
INTRAMUSCULAR | Status: AC
  Filled 2018-11-30: qty 4

## 2018-11-30 MED ORDER — KETOROLAC TROMETHAMINE 30 MG/ML IJ SOLN: 30.0000 mg | Freq: Once | INTRAMUSCULAR | Status: DC | PRN

## 2018-11-30 MED ORDER — FENTANYL CITRATE (PF) 250 MCG/5ML IJ SOLN
INTRAMUSCULAR | Status: DC | PRN
  Administered 2018-11-30: 50 ug via INTRAVENOUS
  Administered 2018-11-30: 25 ug via INTRAVENOUS
  Administered 2018-11-30: 50 ug via INTRAVENOUS
  Administered 2018-11-30: 25 ug via INTRAVENOUS

## 2018-11-30 MED ORDER — ROPIVACAINE HCL 5 MG/ML IJ SOLN
INTRAMUSCULAR | Status: DC | PRN
  Administered 2018-11-30: 30 mL via PERINEURAL

## 2018-11-30 MED ORDER — DEXAMETHASONE SODIUM PHOSPHATE 10 MG/ML IJ SOLN
INTRAMUSCULAR | Status: AC
  Filled 2018-11-30: qty 1

## 2018-11-30 MED ORDER — HYDROMORPHONE HCL 1 MG/ML IJ SOLN: 0.2500 mg | INTRAMUSCULAR | Status: DC | PRN

## 2018-11-30 MED ORDER — ONDANSETRON HCL 4 MG/2ML IJ SOLN
INTRAMUSCULAR | Status: AC
  Filled 2018-11-30: qty 2

## 2018-11-30 MED ORDER — FENTANYL CITRATE (PF) 250 MCG/5ML IJ SOLN
INTRAMUSCULAR | Status: AC
  Filled 2018-11-30: qty 5

## 2018-11-30 MED ORDER — FENTANYL CITRATE (PF) 100 MCG/2ML IJ SOLN
INTRAMUSCULAR | Status: AC
  Filled 2018-11-30: qty 2

## 2018-11-30 MED ORDER — MORPHINE SULFATE (PF) 4 MG/ML IV SOLN
INTRAVENOUS | Status: AC
  Filled 2018-11-30: qty 2

## 2018-11-30 MED ORDER — POVIDONE-IODINE 10 % EX SWAB: 2.0000 "application " | Freq: Once | CUTANEOUS | Status: DC

## 2018-11-30 MED ORDER — SODIUM CHLORIDE 0.9 % IR SOLN
Status: DC | PRN
  Administered 2018-11-30: 1000 mL

## 2018-11-30 MED ORDER — CEFAZOLIN SODIUM-DEXTROSE 2-4 GM/100ML-% IV SOLN
INTRAVENOUS | Status: AC
  Filled 2018-11-30: qty 100

## 2018-11-30 MED ORDER — CEFAZOLIN SODIUM-DEXTROSE 1-4 GM/50ML-% IV SOLN
1.0000 g | Freq: Once | INTRAVENOUS | Status: AC
  Administered 2018-11-30: 1 g via INTRAVENOUS

## 2018-11-30 MED FILL — METHOCARBAMOL 500 MG TABS: 500 | 10 days supply | Qty: 30 | Fill #0

## 2018-11-30 MED FILL — XARELTO 10 MG TABLET: 10 | 12 days supply | Qty: 12 | Fill #0

## 2018-11-30 SURGICAL SUPPLY — 88 items
ANCH SUT SWLK 19.1X4.75 VT (Anchor) ×1 IMPLANT
ANCHOR BUTTON TIGHTROPE ACL RT (Orthopedic Implant) ×2 IMPLANT
ANCHOR PEEK 4.75X19.1 SWLK C (Anchor) ×1 IMPLANT
BANDAGE ESMARK 6X9 LF (GAUZE/BANDAGES/DRESSINGS) ×1 IMPLANT
BLADE EXCALIBUR 4.0X13 (MISCELLANEOUS) ×1 IMPLANT
BLADE SURG 10 STRL SS (BLADE) ×2 IMPLANT
BLADE SURG 15 STRL LF DISP TIS (BLADE) ×2 IMPLANT
BLADE SURG 15 STRL SS (BLADE) ×4
BNDG CMPR 9X6 STRL LF SNTH (GAUZE/BANDAGES/DRESSINGS) ×1
BNDG CMPR MED 10X6 ELC LF (GAUZE/BANDAGES/DRESSINGS) ×1
BNDG CMPR MED 15X6 ELC VLCR LF (GAUZE/BANDAGES/DRESSINGS) ×2
BNDG ELASTIC 6X10 VLCR STRL LF (GAUZE/BANDAGES/DRESSINGS) ×1 IMPLANT
BNDG ELASTIC 6X15 VLCR STRL LF (GAUZE/BANDAGES/DRESSINGS) ×3 IMPLANT
BNDG ESMARK 6X9 LF (GAUZE/BANDAGES/DRESSINGS) ×2
BURR OVAL 8 FLU 4.0X13 (MISCELLANEOUS) ×1 IMPLANT
CLSR STERI-STRIP ANTIMIC 1/2X4 (GAUZE/BANDAGES/DRESSINGS) ×1 IMPLANT
COVER SURGICAL LIGHT HANDLE (MISCELLANEOUS) ×2 IMPLANT
COVER WAND RF STERILE (DRAPES) ×1 IMPLANT
CUFF TOURN SGL QUICK 34 (TOURNIQUET CUFF) ×2
CUFF TOURN SGL QUICK 42 (TOURNIQUET CUFF) IMPLANT
CUFF TRNQT CYL 34X4.125X (TOURNIQUET CUFF) IMPLANT
CUTTER FLIP II 9.5MM (INSTRUMENTS) ×2 IMPLANT
DECANTER SPIKE VIAL GLASS SM (MISCELLANEOUS) ×2 IMPLANT
DRAPE ARTHROSCOPY W/POUCH 114 (DRAPES) ×2 IMPLANT
DRAPE INCISE IOBAN 66X45 STRL (DRAPES) ×2 IMPLANT
DRAPE U-SHAPE 47X51 STRL (DRAPES) ×2 IMPLANT
DRSG PAD ABDOMINAL 8X10 ST (GAUZE/BANDAGES/DRESSINGS) ×6 IMPLANT
DRSG TEGADERM 4X4.75 (GAUZE/BANDAGES/DRESSINGS) ×4 IMPLANT
DW OUTFLOW CASSETTE/TUBE SET (MISCELLANEOUS) ×1 IMPLANT
ELECT REM PT RETURN 9FT ADLT (ELECTROSURGICAL) ×2
ELECTRODE REM PT RTRN 9FT ADLT (ELECTROSURGICAL) ×1 IMPLANT
FIBERSTICK 2 (SUTURE) ×1 IMPLANT
GAUZE SPONGE 4X4 12PLY STRL (GAUZE/BANDAGES/DRESSINGS) ×3 IMPLANT
GAUZE XEROFORM 1X8 LF (GAUZE/BANDAGES/DRESSINGS) ×2 IMPLANT
GAUZE XEROFORM 5X9 LF (GAUZE/BANDAGES/DRESSINGS) ×1 IMPLANT
GLOVE BIO SURGEON ST LM GN SZ9 (GLOVE) ×2 IMPLANT
GLOVE BIOGEL PI IND STRL 8 (GLOVE) ×1 IMPLANT
GLOVE BIOGEL PI INDICATOR 8 (GLOVE) ×1
GLOVE SURG ORTHO 8.0 STRL STRW (GLOVE) ×2 IMPLANT
GOWN STRL REUS W/ TWL LRG LVL3 (GOWN DISPOSABLE) ×3 IMPLANT
GOWN STRL REUS W/ TWL XL LVL3 (GOWN DISPOSABLE) ×1 IMPLANT
GOWN STRL REUS W/TWL LRG LVL3 (GOWN DISPOSABLE) ×6
GOWN STRL REUS W/TWL XL LVL3 (GOWN DISPOSABLE) ×2
IMMOBILIZER KNEE 22 UNIV (SOFTGOODS) ×1 IMPLANT
IMPL SCREW BIO 8X30 (Screw) IMPLANT
IMPLANT SCREW BIO 8X30 (Screw) ×2 IMPLANT
IV NS IRRIG 3000ML ARTHROMATIC (IV SOLUTION) ×9 IMPLANT
KIT BASIN OR (CUSTOM PROCEDURE TRAY) ×2 IMPLANT
KIT BIO-TENODESIS 3X8 DISP (MISCELLANEOUS) ×2
KIT BIOCARTILAGE LG JOINT MIX (KITS) ×1 IMPLANT
KIT INSRT BABSR STRL DISP BTN (MISCELLANEOUS) IMPLANT
KIT TRANSTIBIAL (DISPOSABLE) ×1 IMPLANT
KIT TURNOVER KIT B (KITS) ×2 IMPLANT
MANIFOLD NEPTUNE II (INSTRUMENTS) ×2 IMPLANT
NDL 18GX1X1/2 (RX/OR ONLY) (NEEDLE) ×1 IMPLANT
NEEDLE 18GX1X1/2 (RX/OR ONLY) (NEEDLE) ×2 IMPLANT
NS IRRIG 1000ML POUR BTL (IV SOLUTION) ×2 IMPLANT
PACK ARTHROSCOPY DSU (CUSTOM PROCEDURE TRAY) ×2 IMPLANT
PAD ARMBOARD 7.5X6 YLW CONV (MISCELLANEOUS) ×4 IMPLANT
PAD CAST 4YDX4 CTTN HI CHSV (CAST SUPPLIES) ×1 IMPLANT
PADDING CAST COTTON 4X4 STRL (CAST SUPPLIES) ×2
PADDING CAST COTTON 6X4 STRL (CAST SUPPLIES) ×4 IMPLANT
PENCIL BUTTON HOLSTER BLD 10FT (ELECTRODE) IMPLANT
PK GRAFTLINK AUTO IMPLANT SYST (Anchor) ×2 IMPLANT
PUTTY DBM ALLOSYNC PURE 5CC (Putty) ×1 IMPLANT
SCREW FT BIOCOMP 9X30 (Screw) ×1 IMPLANT
SPONGE LAP 4X18 RFD (DISPOSABLE) ×4 IMPLANT
SUCTION FRAZIER HANDLE 10FR (MISCELLANEOUS) ×1
SUCTION TUBE FRAZIER 10FR DISP (MISCELLANEOUS) ×1 IMPLANT
SUT 2 FIBERLOOP 20 STRT BLUE (SUTURE) ×2
SUT ETHILON 3 0 PS 1 (SUTURE) ×2 IMPLANT
SUT MNCRL AB 3-0 PS2 18 (SUTURE) ×1 IMPLANT
SUT VIC AB 0 CT1 27 (SUTURE) ×2
SUT VIC AB 0 CT1 27XBRD ANBCTR (SUTURE) ×1 IMPLANT
SUT VIC AB 2-0 CT1 27 (SUTURE) ×2
SUT VIC AB 2-0 CT1 TAPERPNT 27 (SUTURE) ×1 IMPLANT
SUTURE 2 FIBERLOOP 20 STRT BLU (SUTURE) ×1 IMPLANT
SYR 30ML LL (SYRINGE) ×2 IMPLANT
SYR BULB IRRIGATION 50ML (SYRINGE) ×2 IMPLANT
SYR CONTROL 10ML LL (SYRINGE) ×1 IMPLANT
SYR TB 1ML LUER SLIP (SYRINGE) ×2 IMPLANT
SYSTEM GRAFT IMPLANT AUTOGRAFT (Anchor) ×1 IMPLANT
TOWEL GREEN STERILE (TOWEL DISPOSABLE) ×4 IMPLANT
TOWEL GREEN STERILE FF (TOWEL DISPOSABLE) ×2 IMPLANT
TUBING ARTHROSCOPY IRRIG 16FT (MISCELLANEOUS) ×3 IMPLANT
UNDERPAD 30X30 (UNDERPADS AND DIAPERS) ×2 IMPLANT
WATER STERILE IRR 1000ML POUR (IV SOLUTION) ×2 IMPLANT
WRAP KNEE MAXI GEL POST OP (GAUZE/BANDAGES/DRESSINGS) ×2 IMPLANT

## 2018-11-30 NOTE — Brief Op Note (Signed)
   11/30/2018  6:31 PM  PATIENT:  Julie Warner  23 y.o. female  PRE-OPERATIVE DIAGNOSIS:  left knee anterior cruciate ligament tear  POST-OPERATIVE DIAGNOSIS:  left knee anterior cruciate ligament tear  PROCEDURE:  Procedure(s): LEFT KNEE ANTERIOR CRUCIATE LIGAMENT (ACL) RECONSTRUCTION  SURGEON:  Surgeon(s): Meredith Pel, MD  ASSISTANT: magnant pa and green rnfa  ANESTHESIA:   general  EBL: 35 ml    Total I/O In: 1000 [I.V.:1000] Out: 25 [Blood:25]  BLOOD ADMINISTERED: none  DRAINS: none   LOCAL MEDICATIONS USED:  Marcaine mso4 clonidine  SPECIMEN:  No Specimen  COUNTS:  YES  TOURNIQUET:  * No tourniquets in log *  DICTATION: .Other Dictation: Dictation Number [id number]  PLAN OF CARE: Discharge to home after PACU  PATIENT DISPOSITION:  PACU - hemodynamically stable

## 2018-11-30 NOTE — Transfer of Care (Signed)
Immediate Anesthesia Transfer of Care Note  Patient: Julie Warner  Procedure(s) Performed: LEFT KNEE ANTERIOR CRUCIATE LIGAMENT (ACL) RECONSTRUCTION (Left Knee)  Patient Location: PACU  Anesthesia Type:General  Level of Consciousness: sedated  Airway & Oxygen Therapy: Patient Spontanous Breathing and Patient connected to face mask oxygen  Post-op Assessment: Report given to RN and Post -op Vital signs reviewed and stable  Post vital signs: Reviewed and stable  Last Vitals:  Vitals Value Taken Time  BP 119/53 11/30/18 1838  Temp    Pulse 73 11/30/18 1839  Resp 16 11/30/18 1839  SpO2 100 % 11/30/18 1839  Vitals shown include unvalidated device data.  Last Pain:  Vitals:   11/30/18 1410  TempSrc:   PainSc: 0-No pain         Complications: No apparent anesthesia complications

## 2018-11-30 NOTE — Anesthesia Postprocedure Evaluation (Signed)
Anesthesia Post Note  Patient: Julie Warner  Procedure(s) Performed: LEFT KNEE ANTERIOR CRUCIATE LIGAMENT (ACL) RECONSTRUCTION (Left Knee)     Patient location during evaluation: PACU Anesthesia Type: Regional and General Level of consciousness: awake and alert Pain management: pain level controlled Vital Signs Assessment: post-procedure vital signs reviewed and stable Respiratory status: spontaneous breathing, nonlabored ventilation, respiratory function stable and patient connected to nasal cannula oxygen Cardiovascular status: blood pressure returned to baseline and stable Postop Assessment: no apparent nausea or vomiting Anesthetic complications: no    Last Vitals:  Vitals:   11/30/18 1940 11/30/18 2000  BP: 116/62 117/69  Pulse: 76 77  Resp: 17 16  Temp: (!) 36.4 C   SpO2: 100% 99%    Last Pain:  Vitals:   11/30/18 2000  TempSrc:   PainSc: 0-No pain                 Catalina Gravel

## 2018-11-30 NOTE — Op Note (Signed)
NAME: Julie Warner, Julie Warner MEDICAL RECORD [id number] ACCOUNT [id number] DATE OF BIRTH:01-12-1996 FACILITY: [facility] LOCATION: [facility] PHYSICIAN:Rhona Fusilier Randel Pigg, MD  OPERATIVE REPORT  DATE OF PROCEDURE:  11/30/2018  PREOPERATIVE DIAGNOSIS:  Left knee anterior cruciate ligament tear.  POSTOPERATIVE DIAGNOSIS:  Left knee anterior cruciate ligament tear.  PROCEDURE:  Left knee anterior cruciate ligament reconstruction using hamstring autograft gracilis and semitendinosis for a 9.5 mm graft.  SURGEON:  Meredith Pel, MD  ASSISTANT:  [facility], [place], and April Green, RNFA.  INDICATIONS:  The patient is a 23 year old patient with about a 6-year history of left knee instability following ACL tear.  She presents now for operative management of ACL tear after explanation of risks and benefits.  PROCEDURE IN DETAIL:  The patient was brought to the operating room where general anesthetic was induced.  Preoperative antibiotics administered.  Timeout was called.  The left knee was pre-scrubbed with alcohol and Betadine, allowed to air dry.  Prep  with DuraPrep solution and draped in sterile manner.  Ioban used to cover the operative field.  Tourniquet was not utilized.  The examination under anesthesia demonstrated full range of motion, stable collateral ligaments, intact PCL, intact  posterolateral corner.  The ACL was out with positive Lachman and positive pivot shift.  Following examination under anesthesia, timeout was called.  The graft was harvested by making an incision over the pes tendons.  Semitendinosis was initially  harvested.  It was folded over to an appropriate length and only had 8 mm graft circumference.  For this reason, the gracilis was added to it.  Together the semitendinosis and gracilis had an appropriate length and 9.5 mm diameter.  At this time, while  with graft preparation was performed on the back table, anterior inferolateral and anterior inferomedial portals were  established.  Diagnostic arthroscopy was performed.  ACL was torn.  PCL intact.  Articular surfaces intact and menisci intact on the  medial and lateral side.  Patellofemoral joint also intact.  No loose bodies in mediolateral gutter.  At this time, notchplasty performed.  Over the top position identified.  An Arthrex guide was then placed at about the 3 o'clock position on that left  knee.  A 9.5 mm tunnel was drilled.  In a similar manner, a tunnel was drilled on the tibial side.  However, with this tunnel, the FlipCutter did not fully engage and when we passed the graft through tunnels that had been bone grafted, there was not  enough girth to the tunnel.  The FlipCutter was broken.  The repeat FlipCutter was performed and this time, we brought out for a complete tunnel.  The graft was passed again and it fit nicely into the socket.  Utilizing a 9 x 30 mm interference screw and  this was done in full extension.  Then, at that time, we did supplemental fixation with a SwiveLock.  This gave excellent graft stability, good range of motion.  Fluoroscopy was also utilized to make sure that the Endobutton flipped on the femoral side.   Thorough irrigation was performed of all incisions including the knee joint.  Portals were closed using 2-0 Vicryl and 3-0 nylon.  Harvest site was closed using 0 Vicryl suture, 2-0 Vicryl suture and a 3-0 Monocryl.  Numbing medicine was placed in the  knee and around all the incisions.  Waterproof dressings were placed along with Ace wrap.  Knee immobilizer was also placed.  The patient tolerated the procedure well without immediate complications.  She was  transferred to the recovery room in stable  condition.  Luke's assistance was required at all times during the case for retraction, graft harvesting and preparation.  His assistance was of medical necessity.  TN/NUANCE  D:11/30/2018 T:11/30/2018 JOB:[id number]

## 2018-11-30 NOTE — Anesthesia Procedure Notes (Signed)
Anesthesia Regional Block: Adductor canal block   Pre-Anesthetic Checklist: ,, timeout performed, Correct Patient, Correct Site, Correct Laterality, Correct Procedure,, site marked, risks and benefits discussed, Surgical consent,  Pre-op evaluation,  At surgeon's request and post-op pain management  Laterality: Left  Prep: chloraprep       Needles:  Injection technique: Single-shot  Needle Type: Echogenic Stimulator Needle     Needle Length: 9cm  Needle Gauge: 21     Additional Needles:   Procedures:,,,, ultrasound used (permanent image in chart),,,,  Narrative:  Start time: 11/30/2018 2:00 PM End time: 11/30/2018 2:10 PM Injection made incrementally with aspirations every 5 mL.  Performed by: Personally  Anesthesiologist: Murvin Natal, MD  Additional Notes: Functioning IV was confirmed and monitors were applied. A time-out was performed. Hand hygiene and sterile gloves were used. The thigh was placed in a frog-leg position and prepped in a sterile fashion. A 76mm 21ga Arrow echogenic stimulator needle was placed using ultrasound guidance.  Negative aspiration and negative test dose prior to incremental administration of local anesthetic. The patient tolerated the procedure well.

## 2018-11-30 NOTE — H&P (Signed)
Julie Warner is an 23 y.o. female.   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  She injured her knee playing soccer about 6 years ago.  She describes clear episodes of symptomatic instability in the left knee.  She is failed conservative management.  Currently she has good support network at home with her mother and husband.  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  She does have a positive family history on her father's side of DVT but she has not had any DVT or pulmonary embolism herself.  Past Medical History:  Diagnosis Date  . Allergy    seasonal   . Anxiety   . Depression     Past Surgical History:  Procedure Laterality Date  . NO PAST SURGERIES      No family history on file. Social History:  reports that she has never smoked. She has never used smokeless tobacco. She reports that she does not drink alcohol or use drugs.  Allergies:  Allergies  Allergen Reactions  . Lexapro [Escitalopram Oxalate] Other (See Comments)    stroke    No medications prior to admission.    No results found for this or any previous visit (from the past 48 hour(s)). No results found.  Review of Systems  Musculoskeletal: Positive for joint pain.  All other systems reviewed and are negative.   Last menstrual period 11/08/2018. Physical Exam  Constitutional: She appears well-developed.  HENT:  Head: Normocephalic.  Eyes: Pupils are equal, round, and reactive to light.  Neck: Normal range of motion.  Cardiovascular: Normal rate.  Respiratory: Effort normal.  Neurological: She is alert.  Skin: Skin is warm.  Psychiatric: She has a normal mood and affect.  Examination of the left knee demonstrates full active and passive range of motion with ACL laxity.  No posterior lateral rotatory instability is noted.  No effusion.  Range of motion is full.  Extensor mechanism is intact.  No other  masses lymphadenopathy or skin changes noted in that left knee region  Assessment/Plan Impression is left knee ACL tear.  Plan is left knee ACL reconstruction using hamstring autograft.  Risk and benefits are discussed including not limited to infection nerve vessel damage loss of motion as well as potential for recurrent instability.  The extensive nature of the rehabilitative process is discussed.  Would like for the patient use CPM machine postop.  All questions answered.  Anderson Malta, MD 11/30/2018, 7:12 AM

## 2018-11-30 NOTE — Anesthesia Procedure Notes (Signed)
Procedure Name: LMA Insertion Date/Time: 11/30/2018 3:30 PM Performed by: Jearld Pies, CRNA Pre-anesthesia Checklist: Patient identified, Emergency Drugs available, Suction available and Patient being monitored Patient Re-evaluated:Patient Re-evaluated prior to induction Oxygen Delivery Method: Circle System Utilized Preoxygenation: Pre-oxygenation with 100% oxygen Induction Type: IV induction Ventilation: Mask ventilation without difficulty LMA: LMA inserted LMA Size: 4.0 Number of attempts: 1 Airway Equipment and Method: Bite block Placement Confirmation: positive ETCO2 Tube secured with: Tape Dental Injury: Teeth and Oropharynx as per pre-operative assessment

## 2018-11-30 NOTE — Anesthesia Preprocedure Evaluation (Addendum)
Anesthesia Evaluation  Patient identified by MRN, date of birth, ID band Patient awake    Reviewed: Allergy & Precautions, NPO status , Patient's Chart, lab work & pertinent test results  Airway Mallampati: I  TM Distance: >3 FB Neck ROM: Full    Dental  (+) Chipped,    Pulmonary neg pulmonary ROS,    Pulmonary exam normal breath sounds clear to auscultation       Cardiovascular negative cardio ROS Normal cardiovascular exam Rhythm:Regular Rate:Normal     Neuro/Psych PSYCHIATRIC DISORDERS Anxiety Depression negative neurological ROS     GI/Hepatic negative GI ROS, Neg liver ROS,   Endo/Other  negative endocrine ROS  Renal/GU negative Renal ROS     Musculoskeletal Chronic pain of left knee   Abdominal   Peds  Hematology negative hematology ROS (+)   Anesthesia Other Findings left knee anterior cruciate ligament tear  Reproductive/Obstetrics hcg negative                            Anesthesia Physical Anesthesia Plan  ASA: II  Anesthesia Plan: General and Regional   Post-op Pain Management: GA combined w/ Regional for post-op pain   Induction: Intravenous  PONV Risk Score and Plan: 3 and Ondansetron, Dexamethasone, Midazolam and Treatment may vary due to age or medical condition  Airway Management Planned: LMA  Additional Equipment:   Intra-op Plan:   Post-operative Plan: Extubation in OR  Informed Consent: I have reviewed the patients History and Physical, chart, labs and discussed the procedure including the risks, benefits and alternatives for the proposed anesthesia with the patient or authorized representative who has indicated his/her understanding and acceptance.     Dental advisory given  Plan Discussed with: CRNA  Anesthesia Plan Comments:         Anesthesia Quick Evaluation

## 2018-11-30 NOTE — Progress Notes (Signed)
Orthopedic Tech Progress Note Patient Details:  Julie Warner 26-Jul-1995 [id number]  Ortho Devices Type of Ortho Device: Crutches Ortho Device/Splint Location: delivered at RNs request. Ortho Device/Splint Interventions: Ordered, Application, Adjustment   Post Interventions Patient Tolerated: Well Instructions Provided: Care of device, Adjustment of device   Karolee Stamps 11/30/2018, 7:50 PM

## 2018-12-01 ENCOUNTER — Telehealth: Payer: Self-pay | Admitting: Orthopedic Surgery

## 2018-12-01 NOTE — Telephone Encounter (Signed)
Please advise on bandage.

## 2018-12-01 NOTE — Telephone Encounter (Signed)
Patient's husband Jobe Gibbon called asked if he can get a call back concerning whether of not to remove the bandage or leave on until she see's Dr Marlou Sa. The number to contact Jobe Gibbon is [phone]

## 2018-12-02 DIAGNOSIS — S83512A Sprain of anterior cruciate ligament of left knee, initial encounter: Secondary | ICD-10-CM

## 2018-12-02 NOTE — Telephone Encounter (Signed)
Remove ace wrap leave dressings under neath

## 2018-12-02 NOTE — Telephone Encounter (Signed)
IC advised.  

## 2018-12-03 ENCOUNTER — Telehealth: Payer: Self-pay | Admitting: Family Medicine

## 2018-12-03 ENCOUNTER — Encounter (HOSPITAL_COMMUNITY): Payer: Self-pay | Admitting: Orthopedic Surgery

## 2018-12-03 ENCOUNTER — Telehealth: Payer: Self-pay

## 2018-12-03 MED ORDER — OXYCODONE HCL 5 MG PO TABS: 5.0000 mg | ORAL_TABLET | Freq: Four times a day (QID) | ORAL | 0 refills | Status: DC | PRN

## 2018-12-03 MED ORDER — PROMETHAZINE HCL 25 MG PO TABS: 25.0000 mg | ORAL_TABLET | Freq: Four times a day (QID) | ORAL | 0 refills | Status: DC | PRN

## 2018-12-03 MED FILL — PROMETHAZINE 25 MG TABLET: 25 | 8 days supply | Qty: 30 | Fill #0

## 2018-12-03 NOTE — Telephone Encounter (Signed)
Patients mother Katharine Look called and requested Oxycodone and a Rx for naseau sent to [facility] [address].   Script was sent to [facility] and patients mother upset because she can't make it to [place] before they close @ 6. [facility] can't trnsfer Rx. A new one has to be [facility] to [facility] [address].  [facility] to [facility] [address].  Please call patient's mother back to advise at correct pharmacy [phone]

## 2018-12-03 NOTE — Addendum Note (Signed)
Addended by: Hortencia Pilar on: 12/03/2018 02:43 PM   Modules accepted: Orders

## 2018-12-03 NOTE — Telephone Encounter (Signed)
Can you advise please  

## 2018-12-03 NOTE — Telephone Encounter (Signed)
Sent!

## 2018-12-03 NOTE — Telephone Encounter (Signed)
Patient's mother Katharine Look called stating that patient needs a Rx refill on Oxydoconde and a Rx for nausea [facility] [address]. Patient had Left Knee surgery on 11/30/2018.  Cb# is [phone].  Please advise.  Thank you.

## 2018-12-03 NOTE — Telephone Encounter (Signed)
I advised the patient's husband that both meds were sent in to [facility].

## 2018-12-08 ENCOUNTER — Telehealth: Payer: Self-pay

## 2018-12-08 ENCOUNTER — Ambulatory Visit (INDEPENDENT_AMBULATORY_CARE_PROVIDER_SITE_OTHER): Payer: 59 | Admitting: Orthopedic Surgery

## 2018-12-08 ENCOUNTER — Encounter: Payer: Self-pay | Admitting: Orthopedic Surgery

## 2018-12-08 DIAGNOSIS — S83512D Sprain of anterior cruciate ligament of left knee, subsequent encounter: Secondary | ICD-10-CM

## 2018-12-08 DIAGNOSIS — M238X2 Other internal derangements of left knee: Secondary | ICD-10-CM

## 2018-12-08 MED ORDER — OXYCODONE HCL 5 MG PO TABS: 5.0000 mg | ORAL_TABLET | Freq: Four times a day (QID) | ORAL | 0 refills | Status: DC | PRN

## 2018-12-08 NOTE — Telephone Encounter (Signed)
Put order in for doppler r/o DVT Dr Marlou Sa said did not need to be done today but tomorrow or Friday

## 2018-12-08 NOTE — Progress Notes (Signed)
   Post-Op Visit Note   Patient: Julie Warner           Date of Birth: 1996-04-23           MRN: [id number] Visit Date: 12/08/2018 PCP: Pa, [place]:  Chief Complaint:  Chief Complaint  Patient presents with  . Left Knee - Routine Post Op   Visit Diagnoses:  1. ACL laxity, left   2. Rupture of anterior cruciate ligament of left knee, subsequent encounter     Plan: Patient is a 23 year old female who presents s/p left ACL reconstruction with hamstring autograft on 11/30/2018.  Patient is doing well and is ambulating in the Bledsoe brace with crutches.  She is at about 80 degrees of flexion but is lacking 10 to 15 degrees of extension.  She has an effusion today in the clinic but states she does not want her knee aspirated.  The graft feels stable with a solid endpoint today.  She is compliant with CPM machine.  Patient is taking Xarelto because of a paternal history of DVT.  Plan for ultrasound to rule out DVT due to her family history; if negative, we will discontinue Xarelto and start aspirin 81 mg twice daily.  She will begin outpatient therapy.  Patient will follow-up with the office in 2 weeks.  Follow-Up Instructions: No follow-ups on file.   Orders:  Orders Placed This Encounter  Procedures  . Ambulatory referral to Physical Medicine Rehab   No orders of the defined types were placed in this encounter.   Imaging: No results found.  PMFS History: Patient Active Problem List   Diagnosis Date Noted  . Left anterior cruciate ligament tear   . Chronic pain of left knee 01/18/2018   Past Medical History:  Diagnosis Date  . Allergy    seasonal   . Anxiety   . Depression     History reviewed. No pertinent family history.  Past Surgical History:  Procedure Laterality Date  . ANTERIOR CRUCIATE LIGAMENT REPAIR Left 11/30/2018   Procedure: LEFT KNEE ANTERIOR CRUCIATE LIGAMENT (ACL) RECONSTRUCTION;  Surgeon: Meredith Pel,  MD;  Location: [facility];  Service: Orthopedics;  Laterality: Left;  . NO PAST SURGERIES     Social History   Occupational History  . Not on file  Tobacco Use  . Smoking status: Never Smoker  . Smokeless tobacco: Never Used  Substance and Sexual Activity  . Alcohol use: No  . Drug use: No  . Sexual activity: Not on file

## 2018-12-09 ENCOUNTER — Other Ambulatory Visit: Payer: Self-pay

## 2018-12-09 ENCOUNTER — Encounter: Payer: Self-pay | Admitting: Orthopedic Surgery

## 2018-12-09 DIAGNOSIS — M238X2 Other internal derangements of left knee: Secondary | ICD-10-CM

## 2018-12-09 DIAGNOSIS — S83512D Sprain of anterior cruciate ligament of left knee, subsequent encounter: Secondary | ICD-10-CM

## 2018-12-09 MED FILL — oxyCODONE HCL 5 MG TABS: 5 | 7 days supply | Qty: 30 | Fill #0

## 2018-12-10 ENCOUNTER — Ambulatory Visit (HOSPITAL_COMMUNITY)
Admission: RE | Admit: 2018-12-10 | Discharge: 2018-12-10 | Disposition: A | Discharge status: Home / Self Care | Payer: 59 | Source: Ambulatory Visit | Attending: Orthopedic Surgery | Admitting: Orthopedic Surgery

## 2018-12-10 ENCOUNTER — Telehealth: Payer: Self-pay

## 2018-12-10 ENCOUNTER — Other Ambulatory Visit: Payer: Self-pay

## 2018-12-10 DIAGNOSIS — S83512D Sprain of anterior cruciate ligament of left knee, subsequent encounter: Secondary | ICD-10-CM | POA: Insufficient documentation

## 2018-12-10 NOTE — Telephone Encounter (Signed)
FYI-  Per Sharee Pimple with [facility], patient is Negative for DVT, left leg.

## 2018-12-10 NOTE — Progress Notes (Signed)
LLE venous duplex       has been completed. Preliminary results can be found under [facility] proc through chart review. June Leap, BS, RDMS, RVT    Called results to April

## 2018-12-10 NOTE — Telephone Encounter (Signed)
FYI

## 2018-12-13 NOTE — Telephone Encounter (Signed)
Pt had done on Fir[place]

## 2018-12-14 ENCOUNTER — Other Ambulatory Visit: Payer: Self-pay | Admitting: Orthopedic Surgery

## 2018-12-15 ENCOUNTER — Other Ambulatory Visit: Payer: Self-pay

## 2018-12-15 ENCOUNTER — Encounter: Payer: Self-pay | Admitting: Physical Therapy

## 2018-12-15 ENCOUNTER — Ambulatory Visit: Payer: 59 | Attending: Orthopedic Surgery | Admitting: Physical Therapy

## 2018-12-15 DIAGNOSIS — R262 Difficulty in walking, not elsewhere classified: Secondary | ICD-10-CM | POA: Insufficient documentation

## 2018-12-15 DIAGNOSIS — M25562 Pain in left knee: Secondary | ICD-10-CM | POA: Insufficient documentation

## 2018-12-15 DIAGNOSIS — Z9889 Other specified postprocedural states: Secondary | ICD-10-CM | POA: Insufficient documentation

## 2018-12-15 DIAGNOSIS — M6281 Muscle weakness (generalized): Secondary | ICD-10-CM | POA: Insufficient documentation

## 2018-12-15 MED ORDER — METHOCARBAMOL 500 MG PO TABS: 500.0000 mg | ORAL_TABLET | Freq: Three times a day (TID) | ORAL | 0 refills | Status: DC | PRN

## 2018-12-15 MED FILL — METHOCARBAMOL 500 MG TABS: 500 | 10 days supply | Qty: 30 | Fill #0

## 2018-12-15 NOTE — Telephone Encounter (Signed)
Ok to rf? 

## 2018-12-15 NOTE — Telephone Encounter (Signed)
Dean patient 

## 2018-12-15 NOTE — Telephone Encounter (Signed)
y

## 2018-12-15 NOTE — Therapy (Signed)
[place], [place], [address] Phone: [phone]   Fax:  [phone]  Physical Therapy Evaluation  Patient Details  Name: Julie Warner MRN: [id number] Date of Birth: November 11, 1995 Referring Provider (PT): Dr.  Marlou Sa   Encounter Date: 12/15/2018  PT End of Session - 12/15/18 1122    Visit Number  1    Number of Visits  16    PT Start Time  9233    PT Stop Time  1140    PT Time Calculation (min)  47 min    Activity Tolerance  Patient tolerated treatment well    Behavior During Therapy  [facility] for tasks assessed/performed       Past Medical History:  Diagnosis Date  . Allergy    seasonal   . Anxiety   . Depression     Past Surgical History:  Procedure Laterality Date  . ANTERIOR CRUCIATE LIGAMENT REPAIR Left 11/30/2018   Procedure: LEFT KNEE ANTERIOR CRUCIATE LIGAMENT (ACL) RECONSTRUCTION;  Surgeon: Meredith Pel, MD;  Location: [facility];  Service: Orthopedics;  Laterality: Left;  . NO PAST SURGERIES      There were no vitals filed for this visit.   Subjective Assessment - 12/15/18 1100    Subjective  Pt with chronic L knee pain and instability.  She had a few visits of PT.  DOS 11/30/18.  She wears CPM 6 hours on 2 hours off x 3 per day. She is limited in all areas of mobility and relies on her family for ADLs, mobility in and out of the home.    Pertinent History  L ACL tear    Limitations  Sitting;Walking;Lifting;House hold activities;Standing;Other (comment)    Diagnostic tests  MRI 2019: Partial tear of the anterior cruciate ligament    Patient Stated Goals  Patient wants to be able to walk normally, be active its been >7 years    Currently in Pain?  Yes    Pain Score  1     Pain Location  Knee    Pain Orientation  Left    Pain Descriptors / Indicators  Tightness;Discomfort    Pain Type  Surgical pain    Pain Onset  1 to 4 weeks ago    Pain Frequency  Intermittent    Aggravating Factors    ROM    Pain Relieving Factors  brace, ice    Effect of Pain on Daily Activities  limits all aspects of mobility         [facility] PT Assessment - 12/15/18 0001      Assessment   Medical Diagnosis  L knee ACL reconstruction     Referring Provider (PT)  Dr.  Marlou Sa    Onset Date/Surgical Date  11/30/18    Prior Therapy  Yes       Precautions   Precautions  None      Restrictions   Weight Bearing Restrictions  Yes    LLE Weight Bearing  Partial weight bearing    Other Position/Activity Restrictions  unclear if she can be WBAT , wears Bledsoe at 0 deg       Balance Screen   Has the patient fallen in the past 6 months  No      [place] residence    Living Arrangements  Spouse/significant other    Available Help at Discharge  Available PRN/intermittently    Type of [place]  Home Access  Level entry    Home Equipment  Crutches    Additional Comments  staying with family       Prior Function   Level of Independence  Independent with household mobility with device;Requires assistive device for independence;Needs assistance with ADLs;Needs assistance with transfers    Vocation  Unemployed    [place] Requirements  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     Leisure  would like to more active but was unable , she and husband have game night, book stores      Cognition   Overall Cognitive Status  Within Functional Limits for tasks assessed      Observation/Other Assessments   Focus on Therapeutic Outcomes (FOTO)   69%      Observation/Other Assessments-Edema    Edema  Circumferential      Circumferential Edema   Circumferential - Right  13 3/4 inch     Circumferential - Left   15 inch       Sensation   Light Touch  Impaired by gross assessment    Additional Comments  gross decreased sensation in calf       AROM   AROM Assessment Site  --   lacks 15 deg quad lag with SLR      PROM   Left Knee Extension  -11    Left  Knee Flexion  98      Strength   Overall Strength Comments  can provide light resistance in knee ext and flexion       Palpation   Patella mobility  hypomobile due to edema and tightness     Palpation comment  min tenderness       Ambulation/Gait   Ambulation Distance (Feet)  150 Feet    Assistive device  Crutches    Gait Pattern  Step-to pattern;Decreased hip/knee flexion - left    Ambulation Surface  Level;Indoor    Gait Comments  partial weightbearing         [facility] Adult PT Treatment/Exercise - 12/15/18 0001      Knee/Hip Exercises: Stretches   Knee: Self-Stretch to increase Flexion  Left;3 reps    Knee: Self-Stretch Limitations  sheet       Knee/Hip Exercises: Aerobic   Stationary Bike  5 min for ROM       Knee/Hip Exercises: Supine   Heel Prop for Knee Extension  3 minutes    Straight Leg Raises  Left;1 set;10 reps         Objective measurements completed on examination: See above findings.     PT Education - 12/15/18 1310    Education Details  PT/POC, to bring in her HEP previous , ROM    Person(s) Educated  Patient    Methods  Explanation    Comprehension  Verbalized understanding;Returned demonstration       PT Short Term Goals - 12/15/18 1312      PT SHORT TERM GOAL #1   Title  pt will be I with inital HEP    Time  3    Period  Weeks    Status  New    Target Date  01/05/19      PT SHORT TERM GOAL #2   Title  Pt will be able to perform SLR for 10 sec x 10 reps with no more than 5 deg quad lag.    Time  3    Period  Weeks    Status  New  Target Date  01/05/19        PT Long Term Goals - 12/15/18 1355      PT LONG TERM GOAL #1   Title  Pt will be able to demo HEP with independence    Time  8    Period  Weeks    Status  New    Target Date  02/09/19      PT LONG TERM GOAL #2   Title  Pt will demo gross LE strength 5/5 to prepare for higher levels of physical activity    Time  8    Period  Weeks    Status  New    Target Date   02/09/19      PT LONG TERM GOAL #3   Title  Pt will be able to extend knee to 0 deg for safety and normal gait pattern (0 deg quad lag)    Time  8    Period  Weeks    Status  New    Target Date  02/09/19      PT LONG TERM GOAL #4   Title  Pt will ambulate as she needs to in the community with least restrictive asst. device and no more than min pain (<3/10)    Time  8    Period  Weeks    Status  New    Target Date  02/09/19      PT LONG TERM GOAL #5   Title  FOTO will be 45% or less limited.    Time  8    Period  Weeks    Status  New    Target Date  02/09/19      Additional Long Term Goals   Additional Long Term Goals  Yes      PT LONG TERM GOAL #6   Title  Pt will flex knee to 120 deg for normal transfers from low surface, squatting    Time  8    Period  Weeks    Status  New    Target Date  02/09/19             Plan - 12/15/18 1501    Clinical Impression Statement  Pt presents for low complexity eval of L knee following ACL reconstruction after years of pain and instability.  She is doing well, despite decreased AROM and strength, edema .  She is 2 weeks post op and is still PWB with crutches. AROM today was 11-0-90.  Used recumbant bike for 5 min and was able to get full revolutions with min difficulty.    Personal Factors and Comorbidities  Past/Current Experience;Fitness    Examination-Activity Limitations  Bathing;Squat;Lift;Stairs;Stand;Locomotion Level;Bend;Toileting;Transfers;Dressing    Examination-Participation Restrictions  Cleaning;Community Activity;Meal Prep;Interpersonal Relationship;Other   work   Stability/Clinical Decision Making  Stable/Uncomplicated    Clinical Decision Making  Low    Rehab Potential  Excellent    PT Frequency  2x / week    PT Duration  8 weeks    PT Treatment/Interventions  ADLs/Self Care Home Management;Electrical Stimulation;Therapeutic activities;Patient/family education;Taping;Vasopneumatic Device;Scar mobilization;Passive  range of motion;Manual techniques;Functional mobility training;Therapeutic exercise;Ultrasound;Cryotherapy;Neuromuscular re-education;Gait training;Stair training;Balance training;DME Instruction    PT Next Visit Plan  begin  closed chain, look at previous HEP and streamlined , modalities , ROM    PT Home Exercise Plan  has from previous, verball told her quad sets, knee AAROM    Consulted and Agree with Plan of Care  Patient  Patient will benefit from skilled therapeutic intervention in order to improve the following deficits and impairments:  Abnormal gait, Decreased knowledge of use of DME, Pain, Impaired sensation, Decreased scar mobility, Decreased activity tolerance, Decreased range of motion, Decreased strength, Hypomobility, Increased edema, Difficulty walking, Decreased mobility  Visit Diagnosis: 1. Muscle weakness (generalized)   2. Difficulty in walking, not elsewhere classified   3. Acute pain of left knee   4. S/P ACL repair        Problem List Patient Active Problem List   Diagnosis Date Noted  . Left anterior cruciate ligament tear   . Chronic pain of left knee 01/18/2018    PAA,JENNIFER 12/15/2018, 3:19 PM  [facility] [address] [place], [place], [address] Phone: [phone]   Fax:  [phone]  Name: Julie Warner MRN: [id number] Date of Birth: [birth year]   [name], PT 12/15/18 3:19 PM Phone: [phone] Fax: [phone]

## 2018-12-16 ENCOUNTER — Other Ambulatory Visit: Payer: Self-pay

## 2018-12-16 NOTE — Telephone Encounter (Signed)
Please advise. Thanks.  

## 2018-12-16 NOTE — Telephone Encounter (Signed)
Can you please submit?

## 2018-12-16 NOTE — Telephone Encounter (Signed)
Dean patient 

## 2018-12-16 NOTE — Telephone Encounter (Signed)
Ok to rf pls calal thx

## 2018-12-17 MED ORDER — OXYCODONE HCL 5 MG PO TABS: 5.0000 mg | ORAL_TABLET | Freq: Four times a day (QID) | ORAL | 0 refills | Status: DC | PRN

## 2018-12-17 MED FILL — oxyCODONE HCL 5 MG TABS: 5 | 8 days supply | Qty: 30 | Fill #0

## 2018-12-20 ENCOUNTER — Other Ambulatory Visit: Payer: Self-pay

## 2018-12-20 ENCOUNTER — Ambulatory Visit (INDEPENDENT_AMBULATORY_CARE_PROVIDER_SITE_OTHER): Payer: 59 | Admitting: Orthopedic Surgery

## 2018-12-20 ENCOUNTER — Encounter: Payer: Self-pay | Admitting: Orthopedic Surgery

## 2018-12-20 DIAGNOSIS — S83512D Sprain of anterior cruciate ligament of left knee, subsequent encounter: Secondary | ICD-10-CM

## 2018-12-20 NOTE — Progress Notes (Signed)
   Post-Op Visit Note   Patient: Julie Warner           Date of Birth: [birth year]           MRN: [id number] Visit Date: 12/20/2018 PCP: Pa, [place]:  Chief Complaint:  Chief Complaint  Patient presents with  . Left Leg - Wound Check   Visit Diagnoses: No diagnosis found.  Plan: Pt is a 23 y.o. Female who presents to the clinic s/p ACL reconstruction on 11/30/18.  Pt states that she began having yellowish drainage with a slight odor earlier today, around 11 AM.  She notes it is only from the lateral portal and has had no drainage from any of the other incision sites.  She noticed after using the CPM machine.  Denies any fevers or chills but states she felt somewhat rundown yesterday, which is similar to how she felt in the past when she had an infection following wisdom teeth removal.  No expressible drainage.  Patient has no significant redness surrounding the portal site.  Additionally on exam she has good range of motion in knee flexion and easily reaches 90 degrees but has stiffness with knee extension with an extension lag of 10 to 15 degrees.  Strongly encourage patient to focus on straightening her leg in the next week as she is about 3 weeks out now and it will soon become very difficult to reach full extension.  Patient understands and will work on extension range of motion.  Patient will follow-up on Friday for clinical recheck.  Follow-Up Instructions: No follow-ups on file.   Orders:  No orders of the defined types were placed in this encounter.  No orders of the defined types were placed in this encounter.   Imaging: No results found.  PMFS History: Patient Active Problem List   Diagnosis Date Noted  . Left anterior cruciate ligament tear   . Chronic pain of left knee 01/18/2018   Past Medical History:  Diagnosis Date  . Allergy    seasonal   . Anxiety   . Depression     History reviewed. No pertinent family history.   Past Surgical History:  Procedure Laterality Date  . ANTERIOR CRUCIATE LIGAMENT REPAIR Left 11/30/2018   Procedure: LEFT KNEE ANTERIOR CRUCIATE LIGAMENT (ACL) RECONSTRUCTION;  Surgeon: Meredith Pel, MD;  Location: [facility];  Service: Orthopedics;  Laterality: Left;  . NO PAST SURGERIES     Social History   Occupational History  . Not on file  Tobacco Use  . Smoking status: Never Smoker  . Smokeless tobacco: Never Used  Substance and Sexual Activity  . Alcohol use: No  . Drug use: No  . Sexual activity: Not on file

## 2018-12-21 ENCOUNTER — Encounter: Payer: Self-pay | Admitting: Orthopedic Surgery

## 2018-12-22 ENCOUNTER — Encounter: Payer: Self-pay | Admitting: Orthopedic Surgery

## 2018-12-22 ENCOUNTER — Ambulatory Visit: Payer: 59 | Admitting: Orthopedic Surgery

## 2018-12-24 ENCOUNTER — Encounter: Payer: Self-pay | Admitting: Orthopedic Surgery

## 2018-12-24 ENCOUNTER — Ambulatory Visit (INDEPENDENT_AMBULATORY_CARE_PROVIDER_SITE_OTHER): Payer: 59 | Admitting: Orthopedic Surgery

## 2018-12-24 DIAGNOSIS — S83512D Sprain of anterior cruciate ligament of left knee, subsequent encounter: Secondary | ICD-10-CM

## 2018-12-24 NOTE — Progress Notes (Signed)
   Post-Op Visit Note   Patient: Julie Warner           Date of Birth: 1995/12/20           MRN: [id number] Visit Date: 12/24/2018 PCP: [facility]   Assessment & Plan:  Chief Complaint:  Chief Complaint  Patient presents with  . Left Knee - Follow-up   Visit Diagnoses: No diagnosis found.  Plan: Pt is a 23 y.o. Female who presents to the office s/p Left knee ACL reconstruction on 11/30/18.  She has not had anymore drainage since the last visit.  She denies any fevers/chills/malaise.  She is up to 120 degrees of flexion on the CPM machine but still lacks ~5 degrees of extension.  Her extension is improved compared to 4 days ago.  She still needs some progress, but she is working on straightening her L knee for ~1.5 hours a day. No expressible drainage from her lateral portal incision today and the incision is healing well compared to 4 days ago.  Graft is stable and L knee is without significant effusion. Pt will f/u with the office in 2 weeks. She starts therapy next week.     Follow-Up Instructions: No follow-ups on file.   Orders:  No orders of the defined types were placed in this encounter.  No orders of the defined types were placed in this encounter.   Imaging: No results found.  PMFS History: Patient Active Problem List   Diagnosis Date Noted  . Left anterior cruciate ligament tear   . Chronic pain of left knee 01/18/2018   Past Medical History:  Diagnosis Date  . Allergy    seasonal   . Anxiety   . Depression     History reviewed. No pertinent family history.  Past Surgical History:  Procedure Laterality Date  . ANTERIOR CRUCIATE LIGAMENT REPAIR Left 11/30/2018   Procedure: LEFT KNEE ANTERIOR CRUCIATE LIGAMENT (ACL) RECONSTRUCTION;  Surgeon: Meredith Pel, MD;  Location: [facility];  Service: Orthopedics;  Laterality: Left;  . NO PAST SURGERIES     Social History   Occupational History  . Not on file  Tobacco Use  . Smoking  status: Never Smoker  . Smokeless tobacco: Never Used  Substance and Sexual Activity  . Alcohol use: No  . Drug use: No  . Sexual activity: Not on file

## 2018-12-28 ENCOUNTER — Ambulatory Visit: Payer: 59 | Admitting: Physical Therapy

## 2018-12-28 ENCOUNTER — Other Ambulatory Visit: Payer: Self-pay

## 2018-12-28 ENCOUNTER — Encounter: Payer: Self-pay | Admitting: Physical Therapy

## 2018-12-28 DIAGNOSIS — M6281 Muscle weakness (generalized): Secondary | ICD-10-CM | POA: Diagnosis not present

## 2018-12-28 DIAGNOSIS — M25562 Pain in left knee: Secondary | ICD-10-CM | POA: Diagnosis not present

## 2018-12-28 DIAGNOSIS — Z9889 Other specified postprocedural states: Secondary | ICD-10-CM

## 2018-12-28 DIAGNOSIS — R262 Difficulty in walking, not elsewhere classified: Secondary | ICD-10-CM

## 2018-12-28 NOTE — Therapy (Signed)
[place], [place], [address] Phone: [phone]   Fax:  [phone]  Physical Therapy Treatment  Patient Details  Name: Julie Warner MRN: [id number] Date of Birth: 1996-01-17 Referring Provider (PT): Dr.  Marlou Sa   Encounter Date: 12/28/2018  PT End of Session - 12/28/18 1457    Visit Number  2    Number of Visits  16    Date for PT Re-Evaluation  02/09/19    PT Start Time  1400    PT Stop Time  1456    PT Time Calculation (min)  56 min    Activity Tolerance  Patient tolerated treatment well    Behavior During Therapy  [facility] for tasks assessed/performed       Past Medical History:  Diagnosis Date  . Allergy    seasonal   . Anxiety   . Depression     Past Surgical History:  Procedure Laterality Date  . ANTERIOR CRUCIATE LIGAMENT REPAIR Left 11/30/2018   Procedure: LEFT KNEE ANTERIOR CRUCIATE LIGAMENT (ACL) RECONSTRUCTION;  Surgeon: Meredith Pel, MD;  Location: [facility];  Service: Orthopedics;  Laterality: Left;  . NO PAST SURGERIES      There were no vitals filed for this visit.  Subjective Assessment - 12/28/18 1404    Subjective  Had some issues with drainage in knee, not infected.  Still really hard to straighten it, been working on it.    Currently in Pain?  No/denies            [facility] Adult PT Treatment/Exercise - 12/28/18 0001      Knee/Hip Exercises: Aerobic   Stationary Bike  5 min L1 for ROM       Knee/Hip Exercises: Standing   Functional Squat Limitations  1/4 squat green band    x 10      Knee/Hip Exercises: Seated   Long Arc Quad  Strengthening;Left;2 sets;15 reps    Long Arc Quad Weight  3 lbs.      Knee/Hip Exercises: Prone   Hamstring Curl  2 sets;10 reps    Hamstring Curl Limitations  2.5 lbs     Hip Extension  Strengthening;Left;2 sets;10 reps    Hip Extension Limitations  2.5 lbs     Prone Knee Hang  1 minute    Prone Knee Hang Weights (lbs)  2.5       Modalities   Modalities  Cryotherapy      Cryotherapy   Number Minutes Cryotherapy  8 Minutes    Cryotherapy Location  Knee    Type of Cryotherapy  Ice pack      Manual Therapy   Manual Therapy  Edema management;Joint mobilization;Soft tissue mobilization    Edema Management  retromasage     Joint Mobilization  patellar and gentle Gr I-II extension     Soft tissue mobilization  gastroc, hamstring followed by self stretch with strap       Ankle Exercises: Standing   Heel Raises  Both;15 reps    Heel Raises Limitations  bilateral, then x 10 unilateral              PT Education - 12/28/18 1455    Education Details  ROM, HEP    Person(s) Educated  Patient    Methods  Demonstration;Explanation;Handout    Comprehension  Verbalized understanding;Returned demonstration       PT Short Term Goals - 12/15/18 1312      PT SHORT TERM GOAL #  1   Title  pt will be I with inital HEP    Time  3    Period  Weeks    Status  New    Target Date  01/05/19      PT SHORT TERM GOAL #2   Title  Pt will be able to perform SLR for 10 sec x 10 reps with no more than 5 deg quad lag.    Time  3    Period  Weeks    Status  New    Target Date  01/05/19        PT Long Term Goals - 12/15/18 1355      PT LONG TERM GOAL #1   Title  Pt will be able to demo HEP with independence    Time  8    Period  Weeks    Status  New    Target Date  02/09/19      PT LONG TERM GOAL #2   Title  Pt will demo gross LE strength 5/5 to prepare for higher levels of physical activity    Time  8    Period  Weeks    Status  New    Target Date  02/09/19      PT LONG TERM GOAL #3   Title  Pt will be able to extend knee to 0 deg for safety and normal gait pattern (0 deg quad lag)    Time  8    Period  Weeks    Status  New    Target Date  02/09/19      PT LONG TERM GOAL #4   Title  Pt will ambulate as she needs to in the community with least restrictive asst. device and no more than min pain (<3/10)     Time  8    Period  Weeks    Status  New    Target Date  02/09/19      PT LONG TERM GOAL #5   Title  FOTO will be 45% or less limited.    Time  8    Period  Weeks    Status  New    Target Date  02/09/19      Additional Long Term Goals   Additional Long Term Goals  Yes      PT LONG TERM GOAL #6   Title  Pt will flex knee to 120 deg for normal transfers from low surface, squatting    Time  8    Period  Weeks    Status  New    Target Date  02/09/19            Plan - 12/28/18 1621    Clinical Impression Statement  Pt with continued knee edema and limitations in strength, ROM and stability due to ACL repair.  She is 4 weeks post op today.  PROM to -5 deg today, 8 deg with AAROM.  Given a more challenging HEP than previous, focusing more on open chain and quads. Her incision is looking good today, saw MD about it as it was appearing red and inflamed days ago.    PT Treatment/Interventions  ADLs/Self Care Home Management;Electrical Stimulation;Therapeutic activities;Patient/family education;Taping;Vasopneumatic Device;Scar mobilization;Passive range of motion;Manual techniques;Functional mobility training;Therapeutic exercise;Ultrasound;Cryotherapy;Neuromuscular re-education;Gait training;Stair training;Balance training;DME Instruction    PT Next Visit Plan  begin  closed chain, , modalities , ROM extension , protocol for guide    PT Home Exercise Plan  hip SLR flexion, ext  and abd, standing heel raise, 1/4 squat with band, hamstring stretch seated and supine    Consulted and Agree with Plan of Care  Patient       Patient will benefit from skilled therapeutic intervention in order to improve the following deficits and impairments:  Abnormal gait, Decreased knowledge of use of DME, Pain, Impaired sensation, Decreased scar mobility, Decreased activity tolerance, Decreased range of motion, Decreased strength, Hypomobility, Increased edema, Difficulty walking, Decreased mobility  Visit  Diagnosis: Muscle weakness (generalized)  Difficulty in walking, not elsewhere classified  Acute pain of left knee  S/P ACL repair     Problem List Patient Active Problem List   Diagnosis Date Noted  . Left anterior cruciate ligament tear   . Chronic pain of left knee 01/18/2018    Clarece Drzewiecki 12/28/2018, 4:26 PM  [facility] [address] [place], [place], [address] Phone: [phone]   Fax:  [phone]  Name: LYNNZEE STANISLAW MRN: [id number] Date of Birth: [birth year]   [name], PT 12/28/18 4:26 PM Phone: [phone] Fax: [phone]

## 2018-12-30 ENCOUNTER — Ambulatory Visit: Payer: 59 | Admitting: Physical Therapy

## 2019-01-04 ENCOUNTER — Other Ambulatory Visit: Payer: Self-pay

## 2019-01-04 ENCOUNTER — Ambulatory Visit: Payer: 59 | Attending: Orthopedic Surgery | Admitting: Physical Therapy

## 2019-01-04 ENCOUNTER — Encounter: Payer: Self-pay | Admitting: Physical Therapy

## 2019-01-04 DIAGNOSIS — G8929 Other chronic pain: Secondary | ICD-10-CM | POA: Diagnosis not present

## 2019-01-04 DIAGNOSIS — M25562 Pain in left knee: Secondary | ICD-10-CM | POA: Insufficient documentation

## 2019-01-04 DIAGNOSIS — M6281 Muscle weakness (generalized): Secondary | ICD-10-CM | POA: Diagnosis not present

## 2019-01-04 DIAGNOSIS — Z9889 Other specified postprocedural states: Secondary | ICD-10-CM | POA: Insufficient documentation

## 2019-01-04 DIAGNOSIS — R262 Difficulty in walking, not elsewhere classified: Secondary | ICD-10-CM | POA: Insufficient documentation

## 2019-01-04 NOTE — Therapy (Signed)
Julie Warner, [place], [address] Phone: [phone]   Fax:  [phone]  Physical Therapy Treatment  Patient Details  Name: Julie Warner MRN: [id number] Date of Birth: December 31, 1995 Referring Provider (PT): Dr.  Marlou Warner   Encounter Date: 01/04/2019  PT End of Session - 01/04/19 1426    Visit Number  3    Number of Visits  16    Date for PT Re-Evaluation  02/09/19    PT Start Time  1404       Past Medical History:  Diagnosis Date  . Allergy    seasonal   . Anxiety   . Depression     Past Surgical History:  Procedure Laterality Date  . ANTERIOR CRUCIATE LIGAMENT REPAIR Left 11/30/2018   Procedure: LEFT KNEE ANTERIOR CRUCIATE LIGAMENT (ACL) RECONSTRUCTION;  Surgeon: Julie Pel, MD;  Location: [facility];  Service: Orthopedics;  Laterality: Left;  . NO PAST SURGERIES      There were no vitals filed for this visit.  Subjective Assessment - 01/04/19 1408    Subjective  I had to cencel the other day x 2 becasue my knee got so swollen and I could not bend it.  Sees MD Friday.  He may take fluid off of it.    Currently in Pain?  Yes    Pain Score  2     Pain Location  Knee    Pain Orientation  Left    Pain Descriptors / Indicators  Sore    Pain Type  Surgical pain    Pain Onset  More than a month ago    Pain Frequency  Intermittent    Aggravating Factors   doing too much    Pain Relieving Factors  ice, brace    Effect of Pain on Daily Activities  limits gait, cant extend it         [facility] PT Assessment - 01/04/19 0001      Circumferential Edema   Circumferential - Right  13 1/4     Circumferential - Left   14 5/8          [facility] Adult PT Treatment/Exercise - 01/04/19 0001      Knee/Hip Exercises: Stretches   Active Hamstring Stretch  Left;3 reps;30 seconds    Passive Hamstring Stretch Limitations  Passive ext off bolster, 2 min     Knee: Self-Stretch to increase Flexion  Left;3 reps     Knee: Self-Stretch Limitations  strap       Knee/Hip Exercises: Aerobic   Stationary Bike  5 min L2 for ROM       Knee/Hip Exercises: Supine   Quad Sets  AROM;Left;1 set;10 reps    Short Arc [employer]  Strengthening;Left;1 set;20 reps    Short Arc [employer] Limitations  3 lbs     Bridges Limitations  ball squeeze with knee ext x 10     Bridges with [employer]  Strengthening;Both;1 set;10 reps    Heel Prop for Knee Extension  2 minutes    Straight Leg Raises  Left;1 set;10 reps    Straight Leg Raises Limitations  from elevated bolster off table     Straight Leg Raise with External Rotation  AROM;Strengthening;Left;1 set;10 reps      Knee/Hip Exercises: Sidelying   Hip ABduction  Strengthening;Left;1 set;10 reps      Vasopneumatic   Number Minutes Vasopneumatic   15 minutes    Vasopnuematic Location  Knee    Vasopneumatic Pressure  Low    Vasopneumatic Temperature   32               PT Short Term Goals - 01/04/19 1426      PT SHORT TERM GOAL #1   Title  pt will be I with inital HEP    Status  Achieved      PT SHORT TERM GOAL #2   Title  Pt will be able to perform SLR for 10 sec x 10 reps with no more than 5 deg quad lag.    Status  On-going        PT Long Term Goals - 12/15/18 1355      PT LONG TERM GOAL #1   Title  Pt will be able to demo HEP with independence    Time  8    Period  Weeks    Status  New    Target Date  02/09/19      PT LONG TERM GOAL #2   Title  Pt will demo gross LE strength 5/5 to prepare for higher levels of physical activity    Time  8    Period  Weeks    Status  New    Target Date  02/09/19      PT LONG TERM GOAL #3   Title  Pt will be able to extend knee to 0 deg for safety and normal gait pattern (0 deg quad lag)    Time  8    Period  Weeks    Status  New    Target Date  02/09/19      PT LONG TERM GOAL #4   Title  Pt will ambulate as she needs to in the community with least restrictive asst. device and no more than min  pain (<3/10)    Time  8    Period  Weeks    Status  New    Target Date  02/09/19      PT LONG TERM GOAL #5   Title  FOTO will be 45% or less limited.    Time  8    Period  Weeks    Status  New    Target Date  02/09/19      Additional Long Term Goals   Additional Long Term Goals  Yes      PT LONG TERM GOAL #6   Title  Pt will flex knee to 120 deg for normal transfers from low surface, squatting    Time  8    Period  Weeks    Status  New    Target Date  02/09/19            Plan - 01/04/19 1426    Clinical Impression Statement  Pt with slow but steady progress.  needed min cues for HEP.   Increased swelling last week caused her to miss 2 days of PT. Encouraged her to come to her appts even if pain and sweling is an issue as we can help her here to relieve her pain.    PT Treatment/Interventions  ADLs/Self Care Home Management;Electrical Stimulation;Therapeutic activities;Patient/family education;Taping;Vasopneumatic Device;Scar mobilization;Passive range of motion;Manual techniques;Functional mobility training;Therapeutic exercise;Ultrasound;Cryotherapy;Neuromuscular re-education;Gait training;Stair training;Balance training;DME Instruction    PT Next Visit Plan  begin  closed chain, , modalities , ROM extension , protocol for guide    PT Home Exercise Plan  hip SLR flexion, ext and abd, standing heel raise, 1/4 squat with band,  hamstring stretch seated and supine    Consulted and Agree with Plan of Care  Patient       Patient will benefit from skilled therapeutic intervention in order to improve the following deficits and impairments:  Abnormal gait, Decreased knowledge of use of DME, Pain, Impaired sensation, Decreased scar mobility, Decreased activity tolerance, Decreased range of motion, Decreased strength, Hypomobility, Increased edema, Difficulty walking, Decreased mobility  Visit Diagnosis: Muscle weakness (generalized)  Difficulty in walking, not elsewhere  classified  Acute pain of left knee  S/P ACL repair  Chronic pain of left knee     Problem List Patient Active Problem List   Diagnosis Date Noted  . Left anterior cruciate ligament tear   . Chronic pain of left knee 01/18/2018    Julie Warner 01/04/2019, 2:38 PM  [facility] [address] [place], [place], [address] Phone: [phone]   Fax:  [phone]  Name: Julie Warner MRN: [id number] Date of Birth: 1996/02/09  [name], PT 01/04/19 2:38 PM Phone: [phone] Fax: [phone]

## 2019-01-06 ENCOUNTER — Other Ambulatory Visit: Payer: Self-pay

## 2019-01-06 ENCOUNTER — Other Ambulatory Visit: Payer: Self-pay | Admitting: Orthopedic Surgery

## 2019-01-06 ENCOUNTER — Encounter: Payer: Self-pay | Admitting: Physical Therapy

## 2019-01-06 ENCOUNTER — Ambulatory Visit: Payer: 59 | Admitting: Physical Therapy

## 2019-01-06 DIAGNOSIS — R262 Difficulty in walking, not elsewhere classified: Secondary | ICD-10-CM | POA: Diagnosis not present

## 2019-01-06 DIAGNOSIS — Z9889 Other specified postprocedural states: Secondary | ICD-10-CM | POA: Diagnosis not present

## 2019-01-06 DIAGNOSIS — M25562 Pain in left knee: Secondary | ICD-10-CM | POA: Diagnosis not present

## 2019-01-06 DIAGNOSIS — M6281 Muscle weakness (generalized): Secondary | ICD-10-CM | POA: Diagnosis not present

## 2019-01-06 DIAGNOSIS — G8929 Other chronic pain: Secondary | ICD-10-CM

## 2019-01-06 MED ORDER — OXYCODONE HCL 5 MG PO TABS: 5.0000 mg | ORAL_TABLET | Freq: Four times a day (QID) | ORAL | 0 refills | Status: DC | PRN

## 2019-01-06 MED ORDER — METHOCARBAMOL 500 MG PO TABS: 500.0000 mg | ORAL_TABLET | Freq: Three times a day (TID) | ORAL | 0 refills | Status: DC | PRN

## 2019-01-06 MED FILL — oxyCODONE HCL 5 MG TABS: 5 | 8 days supply | Qty: 30 | Fill #0

## 2019-01-06 MED FILL — METHOCARBAMOL 500 MG TABS: 500 | 10 days supply | Qty: 30 | Fill #0

## 2019-01-06 NOTE — Telephone Encounter (Signed)
Lauren please advise, thank you. 

## 2019-01-06 NOTE — Telephone Encounter (Signed)
Your pt. thanks

## 2019-01-06 NOTE — Therapy (Signed)
[place], [place], [address] Phone: [phone]   Fax:  [phone]  Physical Therapy Treatment  Patient Details  Name: Julie Warner MRN: [id number] Date of Birth: 03-17-1996 Referring Provider (PT): Dr.  Marlou Sa   Encounter Date: 01/06/2019  PT End of Session - 01/06/19 1411    Visit Number  4    Number of Visits  16    Date for PT Re-Evaluation  02/09/19    PT Start Time  [id number]    PT Stop Time  1458    PT Time Calculation (min)  50 min    Activity Tolerance  Patient tolerated treatment well    Behavior During Therapy  [facility] for tasks assessed/performed       Past Medical History:  Diagnosis Date  . Allergy    seasonal   . Anxiety   . Depression     Past Surgical History:  Procedure Laterality Date  . ANTERIOR CRUCIATE LIGAMENT REPAIR Left 11/30/2018   Procedure: LEFT KNEE ANTERIOR CRUCIATE LIGAMENT (ACL) RECONSTRUCTION;  Surgeon: Meredith Pel, MD;  Location: [facility];  Service: Orthopedics;  Laterality: Left;  . NO PAST SURGERIES      There were no vitals filed for this visit.  Subjective Assessment - 01/06/19 1410    Subjective  No pain really, swelling is back to baseline, really.    Currently in Pain?  No/denies        [facility] Adult PT Treatment/Exercise - 01/06/19 0001      Knee/Hip Exercises: Stretches   Active Hamstring Stretch  Left;3 reps;30 seconds    ITB Stretch  Left;3 reps;30 seconds      Knee/Hip Exercises: Aerobic   Stationary Bike  5 min L2 for ROM    for stimulus Rt knee      Knee/Hip Exercises: Standing   Forward Lunges  Left;1 set;10 reps    Forward Lunges Limitations  small ROM     Side Lunges  Left;1 set;10 reps    Side Lunges Limitations  small ROM     Lateral Step Up  Left;1 set;15 reps;Hand Hold: 1;Step Height: 4"    Forward Step Up  Left;1 set;15 reps;Hand Hold: 1;Step Height: 4"    Forward Step Up Limitations  encouraged full knee and hip ext.     Wall  Squat  1 set;15 reps    Wall Squat Limitations  1/3       Knee/Hip Exercises: Prone   Hamstring Curl  1 set;15 reps    Hamstring Curl Limitations  4    Hip Extension  Strengthening;Left;1 set;15 reps    Hip Extension Limitations  no wgt.  knee bent       Cryotherapy   Number Minutes Cryotherapy  10 Minutes    Cryotherapy Location  Knee    Type of Cryotherapy  Ice pack               PT Short Term Goals - 01/04/19 1426      PT SHORT TERM GOAL #1   Title  pt will be I with inital HEP    Status  Achieved      PT SHORT TERM GOAL #2   Title  Pt will be able to perform SLR for 10 sec x 10 reps with no more than 5 deg quad lag.    Status  On-going        PT Long Term Goals - 12/15/18 1355  PT LONG TERM GOAL #1   Title  Pt will be able to demo HEP with independence    Time  8    Period  Weeks    Status  New    Target Date  02/09/19      PT LONG TERM GOAL #2   Title  Pt will demo gross LE strength 5/5 to prepare for higher levels of physical activity    Time  8    Period  Weeks    Status  New    Target Date  02/09/19      PT LONG TERM GOAL #3   Title  Pt will be able to extend knee to 0 deg for safety and normal gait pattern (0 deg quad lag)    Time  8    Period  Weeks    Status  New    Target Date  02/09/19      PT LONG TERM GOAL #4   Title  Pt will ambulate as she needs to in the community with least restrictive asst. device and no more than min pain (<3/10)    Time  8    Period  Weeks    Status  New    Target Date  02/09/19      PT LONG TERM GOAL #5   Title  FOTO will be 45% or less limited.    Time  8    Period  Weeks    Status  New    Target Date  02/09/19      Additional Long Term Goals   Additional Long Term Goals  Yes      PT LONG TERM GOAL #6   Title  Pt will flex knee to 120 deg for normal transfers from low surface, squatting    Time  8    Period  Weeks    Status  New    Target Date  02/09/19            Plan - 01/06/19  1441    Clinical Impression Statement  Focused today on small ROM closed chain and emphasized hamstring on the mat. Progressing, note routed to MD for visit tomorrow.    PT Treatment/Interventions  ADLs/Self Care Home Management;Electrical Stimulation;Therapeutic activities;Patient/family education;Taping;Vasopneumatic Device;Scar mobilization;Passive range of motion;Manual techniques;Functional mobility training;Therapeutic exercise;Ultrasound;Cryotherapy;Neuromuscular re-education;Gait training;Stair training;Balance training;DME Instruction    PT Next Visit Plan  begin  closed chain, , modalities , ROM extension , protocol for guide    PT Home Exercise Plan  hip SLR flexion, ext and abd, standing heel raise, 1/4 squat with band, hamstring stretch seated and supine    Consulted and Agree with Plan of Care  Patient       Patient will benefit from skilled therapeutic intervention in order to improve the following deficits and impairments:  Abnormal gait, Decreased knowledge of use of DME, Pain, Impaired sensation, Decreased scar mobility, Decreased activity tolerance, Decreased range of motion, Decreased strength, Hypomobility, Increased edema, Difficulty walking, Decreased mobility  Visit Diagnosis: Muscle weakness (generalized)  Difficulty in walking, not elsewhere classified  Acute pain of left knee  S/P ACL repair  Chronic pain of left knee     Problem List Patient Active Problem List   Diagnosis Date Noted  . Left anterior cruciate ligament tear   . Chronic pain of left knee 01/18/2018    PAA,JENNIFER 01/06/2019, 3:23 PM  [facility] [address] [place], [place], [address] Phone: [phone]  Fax:  [phone]  Name: CARLESIA KABLE MRN: [id number] Date of Birth: Apr 02, 1996  [name], PT 01/06/19 3:23 PM Phone: [phone] Fax: [phone]

## 2019-01-06 NOTE — Telephone Encounter (Signed)
Your pt , sent to me by mistake.

## 2019-01-06 NOTE — Telephone Encounter (Signed)
Okay to refill please call thanks

## 2019-01-06 NOTE — Telephone Encounter (Signed)
Please advise 

## 2019-01-07 ENCOUNTER — Ambulatory Visit: Payer: 59 | Admitting: Orthopedic Surgery

## 2019-01-11 ENCOUNTER — Encounter: Payer: Self-pay | Admitting: Physical Therapy

## 2019-01-11 ENCOUNTER — Ambulatory Visit: Payer: 59 | Admitting: Physical Therapy

## 2019-01-11 ENCOUNTER — Other Ambulatory Visit: Payer: Self-pay

## 2019-01-11 DIAGNOSIS — M6281 Muscle weakness (generalized): Secondary | ICD-10-CM | POA: Diagnosis not present

## 2019-01-11 DIAGNOSIS — Z9889 Other specified postprocedural states: Secondary | ICD-10-CM | POA: Diagnosis not present

## 2019-01-11 DIAGNOSIS — M25562 Pain in left knee: Secondary | ICD-10-CM

## 2019-01-11 DIAGNOSIS — R262 Difficulty in walking, not elsewhere classified: Secondary | ICD-10-CM | POA: Diagnosis not present

## 2019-01-11 DIAGNOSIS — G8929 Other chronic pain: Secondary | ICD-10-CM | POA: Diagnosis not present

## 2019-01-11 NOTE — Therapy (Signed)
[place], [place], [address] Phone: [phone]   Fax:  [phone]  Physical Therapy Treatment  Patient Details  Name: Julie Warner MRN: [id number] Date of Birth: 07-Jun-1995 Referring Provider (PT): Dr.  Marlou Sa   Encounter Date: 01/11/2019  PT End of Session - 01/11/19 1415    Visit Number  5    Number of Visits  16    Date for PT Re-Evaluation  02/09/19    PT Start Time  1409    PT Stop Time  1459    PT Time Calculation (min)  50 min    Activity Tolerance  Patient tolerated treatment well    Behavior During Therapy  [facility] for tasks assessed/performed       Past Medical History:  Diagnosis Date  . Allergy    seasonal   . Anxiety   . Depression     Past Surgical History:  Procedure Laterality Date  . ANTERIOR CRUCIATE LIGAMENT REPAIR Left 11/30/2018   Procedure: LEFT KNEE ANTERIOR CRUCIATE LIGAMENT (ACL) RECONSTRUCTION;  Surgeon: Meredith Pel, MD;  Location: [facility] [place];  Service: Orthopedics;  Laterality: Left;  . NO PAST SURGERIES      There were no vitals filed for this visit.  Subjective Assessment - 01/11/19 1414    Subjective  Its a little sore, I missed my doctor appt.  because I couldn't get out of bed.    Currently in Pain?  No/denies          [facility] Adult PT Treatment/Exercise - 01/11/19 0001      Self-Care   Self-Care  Other Self-Care Comments    Other Self-Care Comments   feedback, visual and tactile for quad sets, quad activity      Knee/Hip Exercises: Stretches   Active Hamstring Stretch  Left;3 reps;30 seconds    Passive Hamstring Stretch Limitations  Passive ext off bolster, 2 min       Knee/Hip Exercises: Aerobic   Stationary Bike  6 min intervals, warm up       Knee/Hip Exercises: Seated   Long Arc Quad  Strengthening;Left;2 sets;20 reps    Long Arc Quad Weight  5 lbs.    Hamstring Curl  Strengthening;Left;2 sets;20 reps    Hamstring Limitations  green      Knee/Hip Exercises: Supine   Quad Sets Limitations  long sitting quad sets     Bridges  Strengthening;Both;1 set;10 reps    Straight Leg Raises Limitations  propped on elbows x 10, then SLR hover out and in x 10     Other Supine Knee/Hip Exercises  clam supine green band x 20       Cryotherapy   Number Minutes Cryotherapy  10 Minutes    Cryotherapy Location  Knee    Type of Cryotherapy  Ice pack      Manual Therapy   Joint Mobilization  Gr I-II extension mobs     Soft tissue mobilization  quads, patellar mobs               PT Short Term Goals - 01/04/19 1426      PT SHORT TERM GOAL #1   Title  pt will be I with inital HEP    Status  Achieved      PT SHORT TERM GOAL #2   Title  Pt will be able to perform SLR for 10 sec x 10 reps with no more than 5 deg quad lag.  Status  On-going        PT Long Term Goals - 12/15/18 1355      PT LONG TERM GOAL #1   Title  Pt will be able to demo HEP with independence    Time  8    Period  Weeks    Status  New    Target Date  02/09/19      PT LONG TERM GOAL #2   Title  Pt will demo gross LE strength 5/5 to prepare for higher levels of physical activity    Time  8    Period  Weeks    Status  New    Target Date  02/09/19      PT LONG TERM GOAL #3   Title  Pt will be able to extend knee to 0 deg for safety and normal gait pattern (0 deg quad lag)    Time  8    Period  Weeks    Status  New    Target Date  02/09/19      PT LONG TERM GOAL #4   Title  Pt will ambulate as she needs to in the community with least restrictive asst. device and no more than min pain (<3/10)    Time  8    Period  Weeks    Status  New    Target Date  02/09/19      PT LONG TERM GOAL #5   Title  FOTO will be 45% or less limited.    Time  8    Period  Weeks    Status  New    Target Date  02/09/19      Additional Long Term Goals   Additional Long Term Goals  Yes      PT LONG TERM GOAL #6   Title  Pt will flex knee to 120 deg for normal  transfers from low surface, squatting    Time  8    Period  Weeks    Status  New    Target Date  02/09/19            Plan - 01/11/19 1419    Clinical Impression Statement  Pt has been walking a bit without brace at home, feels farily stable. Encouraged her to get the official OK from the MD to be safe. Has been doing OK, working on ROM and strength at home. Decreased quad actication but better when in long sitting.    Examination-Activity Limitations  Bathing;Squat;Lift;Stairs;Stand;Locomotion Level;Bend;Toileting;Transfers;Dressing    PT Treatment/Interventions  ADLs/Self Care Home Management;Electrical Stimulation;Therapeutic activities;Patient/family education;Taping;Vasopneumatic Device;Scar mobilization;Passive range of motion;Manual techniques;Functional mobility training;Therapeutic exercise;Ultrasound;Cryotherapy;Neuromuscular re-education;Gait training;Stair training;Balance training;DME Instruction    PT Next Visit Plan  cont closed chain, knee extension ROM and strength, hips, gait without brace when OK> modalities , ROM extension , protocol for guide    PT Home Exercise Plan  hip SLR flexion, ext and abd, standing heel raise, 1/4 squat with band, hamstring stretch seated and supine, seated hamstrgin curl green band    Consulted and Agree with Plan of Care  Patient       Patient will benefit from skilled therapeutic intervention in order to improve the following deficits and impairments:  Abnormal gait, Decreased knowledge of use of DME, Pain, Impaired sensation, Decreased scar mobility, Decreased activity tolerance, Decreased range of motion, Decreased strength, Hypomobility, Increased edema, Difficulty walking, Decreased mobility  Visit Diagnosis: Muscle weakness (generalized)  Difficulty in walking, not elsewhere classified  Acute  pain of left knee  S/P ACL repair     Problem List Patient Active Problem List   Diagnosis Date Noted  . Left anterior cruciate  ligament tear   . Chronic pain of left knee 01/18/2018    Kemoni Ortega 01/11/2019, 4:39 PM  [facility] [address] [place], [place], [address] Phone: [phone]   Fax:  [phone]  Name: CAELYN SANTORE MRN: [id number] Date of Birth: [birth year]  [name], PT 01/11/19 4:39 PM Phone: [phone] Fax: [phone]

## 2019-01-13 ENCOUNTER — Ambulatory Visit: Payer: 59 | Admitting: Physical Therapy

## 2019-01-14 ENCOUNTER — Encounter: Payer: Self-pay | Admitting: Physical Therapy

## 2019-01-17 ENCOUNTER — Ambulatory Visit (INDEPENDENT_AMBULATORY_CARE_PROVIDER_SITE_OTHER): Payer: 59 | Admitting: Orthopedic Surgery

## 2019-01-17 ENCOUNTER — Encounter: Payer: Self-pay | Admitting: Orthopedic Surgery

## 2019-01-17 DIAGNOSIS — S83512D Sprain of anterior cruciate ligament of left knee, subsequent encounter: Secondary | ICD-10-CM

## 2019-01-18 ENCOUNTER — Ambulatory Visit: Payer: 59 | Admitting: Physical Therapy

## 2019-01-20 ENCOUNTER — Other Ambulatory Visit: Payer: Self-pay

## 2019-01-20 ENCOUNTER — Ambulatory Visit: Payer: 59 | Admitting: Physical Therapy

## 2019-01-20 ENCOUNTER — Encounter: Payer: Self-pay | Admitting: Physical Therapy

## 2019-01-20 DIAGNOSIS — Z9889 Other specified postprocedural states: Secondary | ICD-10-CM

## 2019-01-20 DIAGNOSIS — M25562 Pain in left knee: Secondary | ICD-10-CM | POA: Diagnosis not present

## 2019-01-20 DIAGNOSIS — G8929 Other chronic pain: Secondary | ICD-10-CM | POA: Diagnosis not present

## 2019-01-20 DIAGNOSIS — M6281 Muscle weakness (generalized): Secondary | ICD-10-CM | POA: Diagnosis not present

## 2019-01-20 DIAGNOSIS — R262 Difficulty in walking, not elsewhere classified: Secondary | ICD-10-CM | POA: Diagnosis not present

## 2019-01-20 NOTE — Therapy (Signed)
[place], [place], [address] Phone: [phone]   Fax:  [phone]  Physical Therapy Treatment  Patient Details  Name: Julie Warner MRN: [id number] Date of Birth: 03-Apr-1996 Referring Provider (PT): Dr.  Marlou Sa   Encounter Date: 01/20/2019  PT End of Session - 01/20/19 1236    Visit Number  6    Number of Visits  16    Date for PT Re-Evaluation  02/09/19    PT Start Time  [id number]    PT Stop Time  [id number]    PT Time Calculation (min)  43 min       Past Medical History:  Diagnosis Date  . Allergy    seasonal   . Anxiety   . Depression     Past Surgical History:  Procedure Laterality Date  . ANTERIOR CRUCIATE LIGAMENT REPAIR Left 11/30/2018   Procedure: LEFT KNEE ANTERIOR CRUCIATE LIGAMENT (ACL) RECONSTRUCTION;  Surgeon: Meredith Pel, MD;  Location: [facility];  Service: Orthopedics;  Laterality: Left;  . NO PAST SURGERIES      There were no vitals filed for this visit.                    [facility] Adult PT Treatment/Exercise - 01/20/19 0001      Ambulation/Gait   Gait Comments  time spent working on gait , heel strike , step length      Knee/Hip Exercises: Stretches   Active Hamstring Stretch  Left;3 reps;30 seconds    Hip Flexor Stretch  1 rep    Hip Flexor Stretch Limitations  60 sec with strap pull    Gastroc Stretch Limitations  slant board 3 x 30sec      Knee/Hip Exercises: Aerobic   Stationary Bike  5 min L2 for ROM    for stimulus Rt knee      Knee/Hip Exercises: Standing   Forward Lunges  Left;1 set;15 reps    Side Lunges  Left;1 set;15 reps    Lateral Step Up  Left;1 set;15 reps;Hand Hold: 1;Step Height: 4"    Forward Step Up  Left;1 set;15 reps;Hand Hold: 1;Step Height: 6"    Forward Step Up Limitations  encouraged full knee and hip ext.     Wall Squat  1 set;15 reps      Knee/Hip Exercises: Seated   Long Arc Quad  Strengthening;Left;2 sets;20 reps      Knee/Hip Exercises: Supine   Quad Sets  20 reps    Straight Leg Raises  Left;1 set;10 reps    Straight Leg Raises Limitations  with initial quad set       Knee/Hip Exercises: Prone   Other Prone Exercises  quad stretch with strap                PT Short Term Goals - 01/04/19 1426      PT SHORT TERM GOAL #1   Title  pt will be I with inital HEP    Status  Achieved      PT SHORT TERM GOAL #2   Title  Pt will be able to perform SLR for 10 sec x 10 reps with no more than 5 deg quad lag.    Status  On-going        PT Long Term Goals - 12/15/18 1355      PT LONG TERM GOAL #1   Title  Pt will be able to demo HEP with independence  Time  8    Period  Weeks    Status  New    Target Date  02/09/19      PT LONG TERM GOAL #2   Title  Pt will demo gross LE strength 5/5 to prepare for higher levels of physical activity    Time  8    Period  Weeks    Status  New    Target Date  02/09/19      PT LONG TERM GOAL #3   Title  Pt will be able to extend knee to 0 deg for safety and normal gait pattern (0 deg quad lag)    Time  8    Period  Weeks    Status  New    Target Date  02/09/19      PT LONG TERM GOAL #4   Title  Pt will ambulate as she needs to in the community with least restrictive asst. device and no more than min pain (<3/10)    Time  8    Period  Weeks    Status  New    Target Date  02/09/19      PT LONG TERM GOAL #5   Title  FOTO will be 45% or less limited.    Time  8    Period  Weeks    Status  New    Target Date  02/09/19      Additional Long Term Goals   Additional Long Term Goals  Yes      PT LONG TERM GOAL #6   Title  Pt will flex knee to 120 deg for normal transfers from low surface, squatting    Time  8    Period  Weeks    Status  New    Target Date  02/09/19            Plan - 01/20/19 1232    Clinical Impression Statement  Saw MD Monday, will go back in 2 months, he discontinued brace and said extension was where he wanted.  Continued with closed chain strengthening. She had some discomfort but was able to perform therex. Good extension today after tactile cues for quad activation.    PT Next Visit Plan  cont closed chain, knee extension ROM and strength, hips, gait without brace when OK> modalities , ROM extension , protocol for guide    PT Home Exercise Plan  hip SLR flexion, ext and abd, standing heel raise, 1/4 squat with band, hamstring stretch seated and supine, seated hamstrgin curl green band       Patient will benefit from skilled therapeutic intervention in order to improve the following deficits and impairments:  Abnormal gait, Decreased knowledge of use of DME, Pain, Impaired sensation, Decreased scar mobility, Decreased activity tolerance, Decreased range of motion, Decreased strength, Hypomobility, Increased edema, Difficulty walking, Decreased mobility  Visit Diagnosis: Muscle weakness (generalized)  Acute pain of left knee  S/P ACL repair  Difficulty in walking, not elsewhere classified     Problem List Patient Active Problem List   Diagnosis Date Noted  . Left anterior cruciate ligament tear   . Chronic pain of left knee 01/18/2018    Dorene Ar, PTA 01/20/2019, 1:14 PM  [place], [place], [address] Phone: [phone]   Fax:  [phone]  Name: Julie Warner MRN: [id number] Date of Birth: 1995/10/11

## 2019-01-21 ENCOUNTER — Encounter: Payer: Self-pay | Admitting: Orthopedic Surgery

## 2019-01-21 NOTE — Progress Notes (Signed)
   Post-Op Visit Note   Patient: Julie Warner           Date of Birth: [birth year]           MRN: [id number] Visit Date: 01/17/2019 PCP: [facility]   Assessment & Plan:  Chief Complaint:  Chief Complaint  Patient presents with  . Left Knee - Follow-up   Visit Diagnoses: No diagnosis found.  Plan: Patient is a 23 year old female who presents s/p left knee ACL reconstruction on 11/30/2018.  She is about 7 weeks postop.  She still has some swelling but states that this does not interfere with her motion so she does not want the knee aspirated.  She is doing well overall and going to physical therapy 2 times a week for the last month.  They are working on knee extension, quadricep strengthening, stationary bike.  The next step in therapy is hamstring strengthening.  She is taking 1 oxycodone and Robaxin a day.  She reaches 0 to 5 degrees of extension with a soft endpoint.  She has no calf tenderness.  Graft is stable on exam.  Her incisions are healing well and she denies any fever/chills/drainage/malaise.  She is doing very well and she will continue physical therapy.  She is not working to return to any sporting activities.  She will follow-up in 2 months.  Follow-Up Instructions: No follow-ups on file.   Orders:  No orders of the defined types were placed in this encounter.  No orders of the defined types were placed in this encounter.   Imaging: No results found.  PMFS History: Patient Active Problem List   Diagnosis Date Noted  . Left anterior cruciate ligament tear   . Chronic pain of left knee 01/18/2018   Past Medical History:  Diagnosis Date  . Allergy    seasonal   . Anxiety   . Depression     History reviewed. No pertinent family history.  Past Surgical History:  Procedure Laterality Date  . ANTERIOR CRUCIATE LIGAMENT REPAIR Left 11/30/2018   Procedure: LEFT KNEE ANTERIOR CRUCIATE LIGAMENT (ACL) RECONSTRUCTION;  Surgeon: Meredith Pel, MD;  Location: [facility];  Service: Orthopedics;  Laterality: Left;  . NO PAST SURGERIES     Social History   Occupational History  . Not on file  Tobacco Use  . Smoking status: Never Smoker  . Smokeless tobacco: Never Used  Substance and Sexual Activity  . Alcohol use: No  . Drug use: No  . Sexual activity: Not on file

## 2019-01-25 ENCOUNTER — Encounter: Payer: Self-pay | Admitting: Physical Therapy

## 2019-01-25 ENCOUNTER — Other Ambulatory Visit: Payer: Self-pay

## 2019-01-25 ENCOUNTER — Ambulatory Visit: Payer: 59 | Admitting: Physical Therapy

## 2019-01-25 DIAGNOSIS — M25562 Pain in left knee: Secondary | ICD-10-CM

## 2019-01-25 DIAGNOSIS — M6281 Muscle weakness (generalized): Secondary | ICD-10-CM | POA: Diagnosis not present

## 2019-01-25 DIAGNOSIS — R262 Difficulty in walking, not elsewhere classified: Secondary | ICD-10-CM

## 2019-01-25 DIAGNOSIS — G8929 Other chronic pain: Secondary | ICD-10-CM | POA: Diagnosis not present

## 2019-01-25 DIAGNOSIS — Z9889 Other specified postprocedural states: Secondary | ICD-10-CM | POA: Diagnosis not present

## 2019-01-25 MED FILL — LARIN FE 1.5-30 TABLET: 1.5-30 | 84 days supply | Qty: 84 | Fill #0

## 2019-01-25 NOTE — Therapy (Signed)
[place], [place], [address] Phone: [phone]   Fax:  [phone]  Physical Therapy Treatment  Patient Details  Name: Julie Warner MRN: [id number] Date of Birth: 02-Sep-1995 Referring Provider (PT): Dr.  Marlou Sa   Encounter Date: 01/25/2019  PT End of Session - 01/25/19 1234    Visit Number  7    Number of Visits  16    Date for PT Re-Evaluation  02/09/19    PT Start Time  1230    PT Stop Time  1314    PT Time Calculation (min)  44 min       Past Medical History:  Diagnosis Date  . Allergy    seasonal   . Anxiety   . Depression     Past Surgical History:  Procedure Laterality Date  . ANTERIOR CRUCIATE LIGAMENT REPAIR Left 11/30/2018   Procedure: LEFT KNEE ANTERIOR CRUCIATE LIGAMENT (ACL) RECONSTRUCTION;  Surgeon: Meredith Pel, MD;  Location: [facility];  Service: Orthopedics;  Laterality: Left;  . NO PAST SURGERIES      There were no vitals filed for this visit.  Subjective Assessment - 01/25/19 1232    Subjective  pt reports difficulty getting pain under control over the last couple of day averaging 4/10 with up to a 6/10 on Saturday and difficulty walking.    Currently in Pain?  Yes    Pain Score  4     Pain Location  Knee    Pain Orientation  Left    Pain Descriptors / Indicators  Dull   like someone punched me in the knee, sometimes sharp                      [facility] Adult PT Treatment/Exercise - 01/25/19 0001      Knee/Hip Exercises: Stretches   Active Hamstring Stretch  Left;3 reps;30 seconds      Knee/Hip Exercises: Aerobic   Stationary Bike  5 min L2 for ROM    for stimulus Rt knee      Knee/Hip Exercises: Standing   Forward Lunges  Left;1 set;15 reps    Side Lunges  Left;1 set;15 reps    Lateral Step Up  Left;1 set;15 reps;Hand Hold: 1;Step Height: 4"    Forward Step Up Limitations  encouraged full knee and hip ext.     Wall Squat  1 set;15 reps    Other  Standing Knee Exercises  Standing Terminal knee extension, ball at wall press 5 sec x 15       Knee/Hip Exercises: Seated   Long Arc Quad  Strengthening;Left;2 sets;15 reps   2 sets   Long Arc Quad Weight  5 lbs.    Hamstring Curl  Strengthening;Left;2 sets;20 reps    Hamstring Limitations  green      Knee/Hip Exercises: Supine   Quad Sets  20 reps    Quad Sets Limitations  tactile cues, superior patella mobs , small towel behind knee for feed back     Straight Leg Raises  Left;1 set;2 sets;10 reps    Straight Leg Raises Limitations  with initial quad set                PT Short Term Goals - 01/04/19 1426      PT SHORT TERM GOAL #1   Title  pt will be I with inital HEP    Status  Achieved      PT SHORT TERM  GOAL #2   Title  Pt will be able to perform SLR for 10 sec x 10 reps with no more than 5 deg quad lag.    Status  On-going        PT Long Term Goals - 12/15/18 1355      PT LONG TERM GOAL #1   Title  Pt will be able to demo HEP with independence    Time  8    Period  Weeks    Status  New    Target Date  02/09/19      PT LONG TERM GOAL #2   Title  Pt will demo gross LE strength 5/5 to prepare for higher levels of physical activity    Time  8    Period  Weeks    Status  New    Target Date  02/09/19      PT LONG TERM GOAL #3   Title  Pt will be able to extend knee to 0 deg for safety and normal gait pattern (0 deg quad lag)    Time  8    Period  Weeks    Status  New    Target Date  02/09/19      PT LONG TERM GOAL #4   Title  Pt will ambulate as she needs to in the community with least restrictive asst. device and no more than min pain (<3/10)    Time  8    Period  Weeks    Status  New    Target Date  02/09/19      PT LONG TERM GOAL #5   Title  FOTO will be 45% or less limited.    Time  8    Period  Weeks    Status  New    Target Date  02/09/19      Additional Long Term Goals   Additional Long Term Goals  Yes      PT LONG TERM GOAL #6    Title  Pt will flex knee to 120 deg for normal transfers from low surface, squatting    Time  8    Period  Weeks    Status  New    Target Date  02/09/19            Plan - 01/25/19 1235    Clinical Impression Statement  Some increased pain over last few days. She reports pain started a few days after last session. She is having difficulty getting full extension. Progressed with standing terminal knee extension. Needs cues to fire quad in supine.    PT Next Visit Plan  cont closed chain, knee extension ROM and strength, hips, gait without brace when OK> modalities , ROM extension , protocol for guide    PT Home Exercise Plan  hip SLR flexion, ext and abd, standing heel raise, 1/4 squat with band, hamstring stretch seated and supine, seated hamstrgin curl green band       Patient will benefit from skilled therapeutic intervention in order to improve the following deficits and impairments:  Abnormal gait, Decreased knowledge of use of DME, Pain, Impaired sensation, Decreased scar mobility, Decreased activity tolerance, Decreased range of motion, Decreased strength, Hypomobility, Increased edema, Difficulty walking, Decreased mobility  Visit Diagnosis: Muscle weakness (generalized)  Acute pain of left knee  S/P ACL repair  Difficulty in walking, not elsewhere classified  Chronic pain of left knee     Problem List Patient Active Problem List  Diagnosis Date Noted  . Left anterior cruciate ligament tear   . Chronic pain of left knee 01/18/2018    Dorene Ar, PTA 01/25/2019, 1:33 PM  [facility] [address] [place], [place], [address] Phone: [phone]   Fax:  [phone]  Name: Julie Warner MRN: [id number] Date of Birth: 01-Sep-1995

## 2019-01-27 ENCOUNTER — Ambulatory Visit: Payer: 59 | Admitting: Physical Therapy

## 2019-02-01 ENCOUNTER — Ambulatory Visit: Payer: 59 | Admitting: Physical Therapy

## 2019-02-01 ENCOUNTER — Other Ambulatory Visit: Payer: Self-pay

## 2019-02-01 ENCOUNTER — Encounter: Payer: Self-pay | Admitting: Physical Therapy

## 2019-02-01 DIAGNOSIS — M6281 Muscle weakness (generalized): Secondary | ICD-10-CM

## 2019-02-01 DIAGNOSIS — M25562 Pain in left knee: Secondary | ICD-10-CM

## 2019-02-01 DIAGNOSIS — R262 Difficulty in walking, not elsewhere classified: Secondary | ICD-10-CM | POA: Diagnosis not present

## 2019-02-01 DIAGNOSIS — G8929 Other chronic pain: Secondary | ICD-10-CM

## 2019-02-01 DIAGNOSIS — Z9889 Other specified postprocedural states: Secondary | ICD-10-CM | POA: Diagnosis not present

## 2019-02-01 NOTE — Therapy (Signed)
[place], [place], [address] Phone: [phone]   Fax:  [phone]  Physical Therapy Treatment  Patient Details  Name: Julie Warner MRN: [id number] Date of Birth: 17-Aug-1995 Referring Provider (PT): Dr.  Marlou Sa   Encounter Date: 02/01/2019  PT End of Session - 02/01/19 1240    Visit Number  8    Number of Visits  16    Date for PT Re-Evaluation  02/09/19    PT Start Time  1232    PT Stop Time  1311    PT Time Calculation (min)  39 min       Past Medical History:  Diagnosis Date  . Allergy    seasonal   . Anxiety   . Depression     Past Surgical History:  Procedure Laterality Date  . ANTERIOR CRUCIATE LIGAMENT REPAIR Left 11/30/2018   Procedure: LEFT KNEE ANTERIOR CRUCIATE LIGAMENT (ACL) RECONSTRUCTION;  Surgeon: Meredith Pel, MD;  Location: [facility];  Service: Orthopedics;  Laterality: Left;  . NO PAST SURGERIES      There were no vitals filed for this visit.                    [facility] Adult PT Treatment/Exercise - 02/01/19 0001      Ambulation/Gait   Gait Comments  time spent working on gait , heel strike , step length, flexing left knee       Knee/Hip Exercises: Aerobic   Stationary Bike  5 min L3 for ROM    for stimulus Rt knee      Knee/Hip Exercises: Standing   Lateral Step Up  Left;1 set;15 reps;Hand Hold: 1;Step Height: 4"    Forward Step Up  2 sets;10 reps    Forward Step Up Limitations  cues to increase speed    Step Down  15 reps;Hand Hold: 1;Step Height: 4"    Other Standing Knee Exercises  Standing Terminal knee extension, ball at wall press 5 sec x 15       Knee/Hip Exercises: Supine   Quad Sets  20 reps    Quad Sets Limitations  tactile cues, visual cues     Short Arc [employer]  10 reps    Short Arc Quad Sets Limitations  ball under knee     Other Supine Knee/Hip Exercises  long sitting quad sets for visual feed back , then SLR with initial quad set,  remained long sitting                PT Short Term Goals - 01/04/19 1426      PT SHORT TERM GOAL #1   Title  pt will be I with inital HEP    Status  Achieved      PT SHORT TERM GOAL #2   Title  Pt will be able to perform SLR for 10 sec x 10 reps with no more than 5 deg quad lag.    Status  On-going        PT Long Term Goals - 12/15/18 1355      PT LONG TERM GOAL #1   Title  Pt will be able to demo HEP with independence    Time  8    Period  Weeks    Status  New    Target Date  02/09/19      PT LONG TERM GOAL #2   Title  Pt will demo gross LE strength 5/5  to prepare for higher levels of physical activity    Time  8    Period  Weeks    Status  New    Target Date  02/09/19      PT LONG TERM GOAL #3   Title  Pt will be able to extend knee to 0 deg for safety and normal gait pattern (0 deg quad lag)    Time  8    Period  Weeks    Status  New    Target Date  02/09/19      PT LONG TERM GOAL #4   Title  Pt will ambulate as she needs to in the community with least restrictive asst. device and no more than min pain (<3/10)    Time  8    Period  Weeks    Status  New    Target Date  02/09/19      PT LONG TERM GOAL #5   Title  FOTO will be 45% or less limited.    Time  8    Period  Weeks    Status  New    Target Date  02/09/19      Additional Long Term Goals   Additional Long Term Goals  Yes      PT LONG TERM GOAL #6   Title  Pt will flex knee to 120 deg for normal transfers from low surface, squatting    Time  8    Period  Weeks    Status  New    Target Date  02/09/19            Plan - 02/01/19 1312    Clinical Impression Statement  Pt arrives with antlagic gait,lacks left knee flexion and full extension during gait. Time spent with cues to increase left knee flexion prior to heel strike, she tends to hold left leg straight with gait despite lacking extension. Continued with quad activation and ecccentric quad strength. Visual cues are good for her  to perform good quad set. Asked her to perform step ups at home.    PT Next Visit Plan  cont closed chain review forward, lateral and retro step ups. , knee extension ROM and strength, hips, gait without brace when OK> modalities , ROM extension , protocol for guide    PT Home Exercise Plan  hip SLR flexion, ext and abd, standing heel raise, 1/4 squat with band, hamstring stretch seated and supine, seated hamstrgin curl green band       Patient will benefit from skilled therapeutic intervention in order to improve the following deficits and impairments:  Abnormal gait, Decreased knowledge of use of DME, Pain, Impaired sensation, Decreased scar mobility, Decreased activity tolerance, Decreased range of motion, Decreased strength, Hypomobility, Increased edema, Difficulty walking, Decreased mobility  Visit Diagnosis: Muscle weakness (generalized)  Acute pain of left knee  S/P ACL repair  Difficulty in walking, not elsewhere classified  Chronic pain of left knee     Problem List Patient Active Problem List   Diagnosis Date Noted  . Left anterior cruciate ligament tear   . Chronic pain of left knee 01/18/2018    Dorene Ar, PTA 02/01/2019, 1:15 PM  [place], [place], [address] Phone: [phone]   Fax:  [phone]  Name: Julie Warner MRN: [id number] Date of Birth: 1996/01/18

## 2019-02-03 ENCOUNTER — Other Ambulatory Visit: Payer: Self-pay

## 2019-02-03 ENCOUNTER — Ambulatory Visit: Payer: 59 | Attending: Orthopedic Surgery | Admitting: Physical Therapy

## 2019-02-03 ENCOUNTER — Encounter: Payer: Self-pay | Admitting: Physical Therapy

## 2019-02-03 DIAGNOSIS — Z9889 Other specified postprocedural states: Secondary | ICD-10-CM | POA: Insufficient documentation

## 2019-02-03 DIAGNOSIS — M6281 Muscle weakness (generalized): Secondary | ICD-10-CM | POA: Insufficient documentation

## 2019-02-03 DIAGNOSIS — M25562 Pain in left knee: Secondary | ICD-10-CM | POA: Insufficient documentation

## 2019-02-03 DIAGNOSIS — R262 Difficulty in walking, not elsewhere classified: Secondary | ICD-10-CM | POA: Insufficient documentation

## 2019-02-03 NOTE — Therapy (Signed)
[place], [place], [address] Phone: [phone]   Fax:  [phone]  Physical Therapy Treatment  Patient Details  Name: Julie Warner MRN: [id number] Date of Birth: 1995-10-14 Referring Provider (PT): Dr.  Marlou Sa   Encounter Date: 02/03/2019  PT End of Session - 02/03/19 1401    Visit Number  9    Number of Visits  16    Date for PT Re-Evaluation  02/09/19    PT Start Time  9163    PT Stop Time  1440    PT Time Calculation (min)  42 min    Activity Tolerance  Patient tolerated treatment well    Behavior During Therapy  [facility] for tasks assessed/performed       Past Medical History:  Diagnosis Date  . Allergy    seasonal   . Anxiety   . Depression     Past Surgical History:  Procedure Laterality Date  . ANTERIOR CRUCIATE LIGAMENT REPAIR Left 11/30/2018   Procedure: LEFT KNEE ANTERIOR CRUCIATE LIGAMENT (ACL) RECONSTRUCTION;  Surgeon: Meredith Pel, MD;  Location: [facility];  Service: Orthopedics;  Laterality: Left;  . NO PAST SURGERIES      There were no vitals filed for this visit.  Subjective Assessment - 02/03/19 1358    Subjective  Pt has had no pain and has not taken pain meds in awhile (2 weeks).    Currently in Pain?  No/denies         [facility] PT Assessment - 02/03/19 0001      Observation/Other Assessments   Focus on Therapeutic Outcomes (FOTO)   unable to find in system, was closed         [facility] Adult PT Treatment/Exercise - 02/03/19 0001      Knee/Hip Exercises: Stretches   Active Hamstring Stretch  Left;3 reps;30 seconds    ITB Stretch  Left;3 reps      Knee/Hip Exercises: Aerobic   Stationary Bike  6 min L2 intervals and incr resistance       Knee/Hip Exercises: Standing   Forward Step Up  Left;2 sets;10 reps;Hand Hold: 1;Step Height: 8"    Functional Squat  1 set;10 reps    Functional Squat Limitations  with band     Wall Squat  1 set;15 reps    Wall Squat Limitations   wgt in heels     Other Standing Knee Exercises  lateral band walks green x 10 x 3       Knee/Hip Exercises: Seated   Long Arc Quad  Strengthening;Left;2 sets;15 reps   2 sets   Long Arc Quad Weight  5 lbs.    Long Arc Quad Limitations  ball squeeze     Hamstring Curl  Strengthening;Left;2 sets;20 reps    Hamstring Limitations  green      Knee/Hip Exercises: Supine   Straight Leg Raises  Left;1 set;15 reps    Straight Leg Raise with External Rotation  Left;1 set;15 reps    Other Supine Knee/Hip Exercises  hip, knee flexion green band LLE x 10       Knee/Hip Exercises: Sidelying   Hip ABduction  Strengthening;Both;1 set;15 reps    Other Sidelying Knee/Hip Exercises  abd with hip flexion, ext sidelying for lateral hip, x 15 each                PT Short Term Goals - 01/04/19 1426      PT SHORT TERM GOAL #1  Title  pt will be I with inital HEP    Status  Achieved      PT SHORT TERM GOAL #2   Title  Pt will be able to perform SLR for 10 sec x 10 reps with no more than 5 deg quad lag.    Status  On-going        PT Long Term Goals - 02/03/19 1427      PT LONG TERM GOAL #1   Title  Pt will be able to demo HEP with independence    Status  On-going      PT LONG TERM GOAL #2   Title  Pt will demo gross LE strength 5/5 to prepare for higher levels of physical activity    Status  On-going      PT LONG TERM GOAL #3   Title  Pt will be able to extend knee to 0 deg for safety and normal gait pattern (0 deg quad lag)    Baseline  -6 deg    Status  On-going      PT LONG TERM GOAL #4   Title  Pt will ambulate as she needs to in the community with least restrictive asst. device and no more than min pain (<3/10)    Baseline  met, has been out for 3 hours limited by fatigue not pain      PT LONG TERM GOAL #5   Title  FOTO will be 45% or less limited.      PT LONG TERM GOAL #6   Title  128    Status  Achieved            Plan - 02/03/19 1359    Clinical  Impression Statement  Improved gait patterning today.  AROM in L knee 0-6-128 deg. Limited mostly in standing and confidence with squatting and knee control.    Examination-Activity Limitations  Bathing;Squat;Lift;Stairs;Stand;Locomotion Level;Bend;Toileting;Transfers;Dressing    PT Treatment/Interventions  ADLs/Self Care Home Management;Electrical Stimulation;Therapeutic activities;Patient/family education;Taping;Vasopneumatic Device;Scar mobilization;Passive range of motion;Manual techniques;Functional mobility training;Therapeutic exercise;Ultrasound;Cryotherapy;Neuromuscular re-education;Gait training;Stair training;Balance training;DME Instruction    PT Next Visit Plan  cont closed chain review forward, lateral and retro step ups. , knee extension ROM and strength, hips, gait without brace when OK> modalities , ROM extension , protocol for guide    PT Home Exercise Plan  hip SLR flexion, ext and abd, standing heel raise, 1/4 squat with band, hamstring stretch seated and supine, seated hamstrgin curl green band    Consulted and Agree with Plan of Care  Patient       Patient will benefit from skilled therapeutic intervention in order to improve the following deficits and impairments:  Abnormal gait, Decreased knowledge of use of DME, Pain, Impaired sensation, Decreased scar mobility, Decreased activity tolerance, Decreased range of motion, Decreased strength, Hypomobility, Increased edema, Difficulty walking, Decreased mobility  Visit Diagnosis: Muscle weakness (generalized)  Acute pain of left knee  S/P ACL repair  Difficulty in walking, not elsewhere classified     Problem List Patient Active Problem List   Diagnosis Date Noted  . Left anterior cruciate ligament tear   . Chronic pain of left knee 01/18/2018    PAA,JENNIFER 02/03/2019, 2:45 PM  [facility] [address] [place], [place], [address] Phone: [phone]   Fax:   [phone]  Name: Julie Warner MRN: [id number] Date of Birth: [birth year]  [name], PT 02/03/19 2:45 PM Phone: [phone] Fax: [phone]

## 2019-02-07 ENCOUNTER — Encounter: Payer: 59 | Admitting: Physical Therapy

## 2019-02-09 ENCOUNTER — Encounter: Payer: Self-pay | Admitting: Physical Therapy

## 2019-02-09 ENCOUNTER — Other Ambulatory Visit: Payer: Self-pay

## 2019-02-09 ENCOUNTER — Ambulatory Visit: Payer: 59 | Admitting: Physical Therapy

## 2019-02-09 DIAGNOSIS — Z9889 Other specified postprocedural states: Secondary | ICD-10-CM | POA: Diagnosis not present

## 2019-02-09 DIAGNOSIS — R262 Difficulty in walking, not elsewhere classified: Secondary | ICD-10-CM

## 2019-02-09 DIAGNOSIS — M25562 Pain in left knee: Secondary | ICD-10-CM | POA: Diagnosis not present

## 2019-02-09 DIAGNOSIS — M6281 Muscle weakness (generalized): Secondary | ICD-10-CM

## 2019-02-09 NOTE — Therapy (Signed)
[place], [place], [address] Phone: [phone]   Fax:  [phone]  Physical Therapy Treatment/Renewal  Patient Details  Name: Julie Warner MRN: [id number] Date of Birth: 01-24-1996 Referring Provider (PT): Dr. Marlou Sa    Encounter Date: 02/09/2019  PT End of Session - 02/09/19 1407    Visit Number  10    Number of Visits  16    Date for PT Re-Evaluation  03/23/19    PT Start Time  1224    PT Stop Time  1441    PT Time Calculation (min)  38 min    Activity Tolerance  Patient tolerated treatment well    Behavior During Therapy  [facility] for tasks assessed/performed       Past Medical History:  Diagnosis Date  . Allergy    seasonal   . Anxiety   . Depression     Past Surgical History:  Procedure Laterality Date  . ANTERIOR CRUCIATE LIGAMENT REPAIR Left 11/30/2018   Procedure: LEFT KNEE ANTERIOR CRUCIATE LIGAMENT (ACL) RECONSTRUCTION;  Surgeon: Meredith Pel, MD;  Location: [facility];  Service: Orthopedics;  Laterality: Left;  . NO PAST SURGERIES      There were no vitals filed for this visit.  Subjective Assessment - 02/09/19 1408    Subjective  Just stiff today.  Noi pain.    How long can you sit comfortably?  stiff after 1 hour    How long can you stand comfortably?  2 hours    How long can you walk comfortably?  2 hours    Patient Stated Goals  Patient wants to be able to walk normally, be active its been >7 years    Currently in Pain?  No/denies         [facility] PT Assessment - 02/09/19 0001      Assessment   Medical Diagnosis  L knee ACL     Referring Provider (PT)  Dr. Marlou Sa     Onset Date/Surgical Date  11/30/18    Hand Dominance  Right    Prior Therapy  Yes       Precautions   Precautions  None      Observation/Other Assessments   Focus on Therapeutic Outcomes (FOTO)   unable to find in system, was closed       AROM   Left Knee Extension  2    Left Knee Flexion  130      Strength   Right Hip Flexion  5/5    Left Hip Flexion  5/5    Left Hip ABduction  4-/5    Right Knee Flexion  5/5    Right Knee Extension  5/5    Left Knee Flexion  4/5    Left Knee Extension  4+/5        [facility] Adult PT Treatment/Exercise - 02/09/19 0001      Knee/Hip Exercises: Aerobic   Stationary Bike  6 min L2 intervals and incr resistance       Knee/Hip Exercises: Standing   Hip Abduction  Stengthening;Both;1 set;10 reps    Forward Step Up  Left;2 sets;20 reps;Hand Hold: 1;Step Height: 4"    Step Down  Left;1 set;20 reps;Hand Hold: 1;Step Height: 4"    Functional Squat Limitations  on BOSU with bilateral UE assist       Knee/Hip Exercises: Supine   Short Arc Quad Sets  Strengthening;Left;2 sets;10 reps    Technical brewer Limitations  2    Bridges  Strengthening;Both;1 set;10 reps    Straight Leg Raises  Left;2 sets;15 reps    Straight Leg Raises Limitations  2 lbs     Straight Leg Raise with External Rotation  Left;2 sets;15 reps    Straight Leg Raise with External Rotation Limitations  2 lbs       Knee/Hip Exercises: Prone   Hip Extension  Strengthening;Left;1 set;10 reps    Straight Leg Raises  Strengthening;Both;1 set;10 reps            PT Short Term Goals - 02/09/19 1417      PT SHORT TERM GOAL #1   Title  pt will be I with inital HEP    Status  Achieved      PT SHORT TERM GOAL #2   Title  Pt will be able to perform SLR for 10 sec x 10 reps with no more than 5 deg quad lag.    Baseline  8 deg    Status  Partially Met        PT Long Term Goals - 02/09/19 1417      PT LONG TERM GOAL #1   Title  Pt will be able to demo HEP with independence    Status  On-going      PT LONG TERM GOAL #2   Title  Pt will demo gross LE strength 5/5 to prepare for higher levels of physical activity    Time  6    Period  Weeks    Status  On-going    Target Date  03/23/19      PT LONG TERM GOAL #3   Title  Pt will be able to extend knee to 0 deg for safety and  normal gait pattern (0 deg quad lag)    Baseline  -2    Status  On-going      PT LONG TERM GOAL #4   Title  Pt will ambulate as she needs to in the community with least restrictive asst. device and no more than min pain (<3/10)    Baseline  no more than 3/10    Status  Achieved      PT LONG TERM GOAL #5   Title  FOTO will be 45% or less limited.    Status  Deferred      PT LONG TERM GOAL #6   Title  Pt will be able to descend a flight of stairs with 1 rail and reciprocal pattern, good knee control.    Baseline  weakness    Time  6    Period  Weeks    Status  New    Target Date  03/23/19            Plan - 02/09/19 1436    Clinical Impression Statement  Patient is progressing, meeting goals related to pain, walking and activity tolerance. She continues to have min weakness in her L knee, noted with descending stairs.  She will benefit from continued PT to improve single stability and control, maximize ROM and functional strength.  FOTO score unknown, was taken out from the system for unknown reason.    Personal Factors and Comorbidities  Past/Current Experience;Fitness    Examination-Activity Limitations  Bathing;Squat;Lift;Stairs;Stand;Locomotion Level;Bend;Toileting;Transfers;Dressing    Examination-Participation Restrictions  Cleaning;Community Activity;Meal Prep;Interpersonal Relationship;Other    Stability/Clinical Decision Making  Stable/Uncomplicated    Clinical Decision Making  Low    Rehab Potential  Excellent    PT Frequency  2x /  week    PT Duration  6 weeks    PT Treatment/Interventions  ADLs/Self Care Home Management;Electrical Stimulation;Therapeutic activities;Patient/family education;Taping;Vasopneumatic Device;Scar mobilization;Passive range of motion;Manual techniques;Functional mobility training;Therapeutic exercise;Ultrasound;Cryotherapy;Neuromuscular re-education;Gait training;Stair training;Balance training;DME Instruction    PT Next Visit Plan  cont closed  chain review forward, lateral and retro step ups. , knee extension ROM and strength, hips, gait without brace when OK> modalities , ROM extension , protocol for guide    PT Home Exercise Plan  hip SLR flexion, ext and abd, standing heel raise, 1/4 squat with band, hamstring stretch seated and supine, seated hamstrgin curl green band    Consulted and Agree with Plan of Care  Patient       Patient will benefit from skilled therapeutic intervention in order to improve the following deficits and impairments:  Abnormal gait, Decreased knowledge of use of DME, Pain, Impaired sensation, Decreased scar mobility, Decreased activity tolerance, Decreased range of motion, Decreased strength, Hypomobility, Increased edema, Difficulty walking, Decreased mobility  Visit Diagnosis: Muscle weakness (generalized)  Acute pain of left knee  S/P ACL repair  Difficulty in walking, not elsewhere classified     Problem List Patient Active Problem List   Diagnosis Date Noted  . Left anterior cruciate ligament tear   . Chronic pain of left knee 01/18/2018    PAA,JENNIFER 02/09/2019, 2:46 PM  [facility] [address] [place], [place], [address] Phone: [phone]   Fax:  [phone]  Name: NICOLLETTE WILHELMI MRN: [id number] Date of Birth: August 12, 1995  [name], PT 02/09/19 2:46 PM Phone: [phone] Fax: [phone]

## 2019-02-14 ENCOUNTER — Other Ambulatory Visit: Payer: Self-pay

## 2019-02-14 ENCOUNTER — Ambulatory Visit: Payer: 59 | Admitting: Physical Therapy

## 2019-02-14 ENCOUNTER — Encounter: Payer: Self-pay | Admitting: Physical Therapy

## 2019-02-14 DIAGNOSIS — Z9889 Other specified postprocedural states: Secondary | ICD-10-CM

## 2019-02-14 DIAGNOSIS — M25562 Pain in left knee: Secondary | ICD-10-CM

## 2019-02-14 DIAGNOSIS — M6281 Muscle weakness (generalized): Secondary | ICD-10-CM

## 2019-02-14 DIAGNOSIS — R262 Difficulty in walking, not elsewhere classified: Secondary | ICD-10-CM

## 2019-02-14 NOTE — Therapy (Addendum)
[place], [place], [address] Phone: [phone]   Fax:  [phone]  Physical Therapy Treatment/Discharge  Patient Details  Name: Julie Warner MRN: [id number] Date of Birth: Jun 25, 1995 Referring Provider (PT): Dr. Marlou Sa    Encounter Date: 02/14/2019  PT End of Session - 02/14/19 1459    Visit Number  11    Date for PT Re-Evaluation  03/23/19    PT Start Time  1445    PT Stop Time  1535    PT Time Calculation (min)  50 min    Activity Tolerance  Patient tolerated treatment well    Behavior During Therapy  [facility] for tasks assessed/performed       Past Medical History:  Diagnosis Date  . Allergy    seasonal   . Anxiety   . Depression     Past Surgical History:  Procedure Laterality Date  . ANTERIOR CRUCIATE LIGAMENT REPAIR Left 11/30/2018   Procedure: LEFT KNEE ANTERIOR CRUCIATE LIGAMENT (ACL) RECONSTRUCTION;  Surgeon: Julie Pel, MD;  Location: [facility];  Service: Orthopedics;  Laterality: Left;  . NO PAST SURGERIES      There were no vitals filed for this visit.  Subjective Assessment - 02/14/19 1458    Subjective  Stiffness today, did alot Saturday, was up walking, had a game night.    Currently in Pain?  No/denies          [facility] Adult PT Treatment/Exercise - 02/14/19 0001      Ambulation/Gait   Stairs  Yes    Stairs Assistance  6: Modified independent (Device/Increase time)    Stair Management Technique  No rails;Alternating pattern    Number of Stairs  12    Height of Stairs  --   4, 6      Knee/Hip Exercises: Stretches   Active Hamstring Stretch  Left;3 reps;30 seconds    ITB Stretch  Left;3 reps      Knee/Hip Exercises: Standing   Heel Raises  Both;1 set;10 reps    Hip Flexion Limitations  high knees x 30    forward and back    Forward Lunges Limitations  small ROM but returned to standing from lunge (no pulse)     Extension Limitations  single leg hip hinge in  parallel bars     Functional Squat  1 set;15 reps    Other Standing Knee Exercises  lateral band walks green x 10 x 3    with and without squat      Cryotherapy   Number Minutes Cryotherapy  10 Minutes    Cryotherapy Location  Knee    Type of Cryotherapy  Ice pack      Manual Therapy   Manual Therapy  Taping    Kinesiotex  Edema      Kinesiotix   Edema  2 fans for edema LUE              PT Education - 02/14/19 1529    Education Details  stiff gait, edema and tape    Person(s) Educated  Patient    Methods  Explanation;Demonstration    Comprehension  Verbalized understanding;Returned demonstration       PT Short Term Goals - 02/09/19 1417      PT SHORT TERM GOAL #1   Title  pt will be I with inital HEP    Status  Achieved      PT SHORT TERM GOAL #2   Title  Pt will be able to perform SLR for 10 sec x 10 reps with no more than 5 deg quad lag.    Baseline  8 deg    Status  Partially Met        PT Long Term Goals - 02/09/19 1417      PT LONG TERM GOAL #1   Title  Pt will be able to demo HEP with independence    Status  On-going      PT LONG TERM GOAL #2   Title  Pt will demo gross LE strength 5/5 to prepare for higher levels of physical activity    Time  6    Period  Weeks    Status  On-going    Target Date  03/23/19      PT LONG TERM GOAL #3   Title  Pt will be able to extend knee to 0 deg for safety and normal gait pattern (0 deg quad lag)    Baseline  -2    Status  On-going      PT LONG TERM GOAL #4   Title  Pt will ambulate as she needs to in the community with least restrictive asst. device and no more than min pain (<3/10)    Baseline  no more than 3/10    Status  Achieved      PT LONG TERM GOAL #5   Title  FOTO will be 45% or less limited.    Status  Deferred      PT LONG TERM GOAL #6   Title  Pt will be able to descend a flight of stairs with 1 rail and reciprocal pattern, good knee control.    Baseline  weakness    Time  6    Period   Weeks    Status  New    Target Date  03/23/19            Plan - 02/14/19 1616    Clinical Impression Statement  Patient continues to complain of stiffness and swelling that does not fluctuate.  She had difficulty with single leg balance work and trusting knee with squatting. Trial of tape for swelling .    PT Treatment/Interventions  ADLs/Self Care Home Management;Electrical Stimulation;Therapeutic activities;Patient/family education;Taping;Vasopneumatic Device;Scar mobilization;Passive range of motion;Manual techniques;Functional mobility training;Therapeutic exercise;Ultrasound;Cryotherapy;Neuromuscular re-education;Gait training;Stair training;Balance training;DME Instruction    PT Next Visit Plan  cont closed chain review forward, lateral and retro step ups. , knee extension ROM and strength, hips, gait without brace when OK> modalities , ROM extension , protocol for guide    PT Home Exercise Plan  hip SLR flexion, ext and abd, standing heel raise, 1/4 squat with band, hamstring stretch seated and supine, seated hamstrgin curl green band    Consulted and Agree with Plan of Care  Patient       Patient will benefit from skilled therapeutic intervention in order to improve the following deficits and impairments:  Abnormal gait, Decreased knowledge of use of DME, Pain, Impaired sensation, Decreased scar mobility, Decreased activity tolerance, Decreased range of motion, Decreased strength, Hypomobility, Increased edema, Difficulty walking, Decreased mobility  Visit Diagnosis: Muscle weakness (generalized)  Acute pain of left knee  S/P ACL repair  Difficulty in walking, not elsewhere classified     Problem List Patient Active Problem List   Diagnosis Date Noted  . Left anterior cruciate ligament tear   . Chronic pain of left knee 01/18/2018    Julie Warner 02/14/2019, 5:04 PM  [place]  [facility] [address] [place], [place],  [address] Phone: [phone]   Fax:  [phone]  Name: Julie Warner MRN: [id number] Date of Birth: Oct 07, 1995  Julie Warner, PT 02/14/19 5:04 PM Phone: [phone] Fax: [phone]  PHYSICAL THERAPY DISCHARGE SUMMARY  Visits from Start of Care: 11  Current functional level related to goals / functional outcomes: See goals above     Remaining deficits: Knee ROM, gait , weakness   Education / Equipment: HEP, gait, RICE  Plan: Patient agrees to discharge.  Patient goals were partially met. Patient is being discharged due to not returning since the last visit.  ?????    Julie Warner, PT 04/19/19 11:23 AM Phone: [phone] Fax: [phone]

## 2019-02-16 ENCOUNTER — Ambulatory Visit: Payer: 59 | Admitting: Physical Therapy

## 2019-02-21 ENCOUNTER — Ambulatory Visit: Payer: 59 | Admitting: Physical Therapy

## 2019-02-23 ENCOUNTER — Ambulatory Visit: Payer: 59 | Admitting: Physical Therapy

## 2019-03-02 ENCOUNTER — Ambulatory Visit: Payer: 59 | Admitting: Physical Therapy

## 2019-03-03 ENCOUNTER — Emergency Department (HOSPITAL_COMMUNITY): Payer: 59

## 2019-03-03 ENCOUNTER — Emergency Department (HOSPITAL_COMMUNITY)
Admission: EM | Admit: 2019-03-03 | Discharge: 2019-03-03 | Disposition: A | Discharge status: Home / Self Care | Payer: 59 | Attending: Emergency Medicine | Admitting: Emergency Medicine

## 2019-03-03 ENCOUNTER — Other Ambulatory Visit: Payer: Self-pay

## 2019-03-03 DIAGNOSIS — R4701 Aphasia: Secondary | ICD-10-CM | POA: Diagnosis present

## 2019-03-03 DIAGNOSIS — R29810 Facial weakness: Secondary | ICD-10-CM | POA: Insufficient documentation

## 2019-03-03 DIAGNOSIS — R471 Dysarthria and anarthria: Secondary | ICD-10-CM | POA: Diagnosis not present

## 2019-03-03 DIAGNOSIS — Z79899 Other long term (current) drug therapy: Secondary | ICD-10-CM | POA: Diagnosis not present

## 2019-03-03 DIAGNOSIS — R531 Weakness: Secondary | ICD-10-CM | POA: Diagnosis not present

## 2019-03-03 DIAGNOSIS — R270 Ataxia, unspecified: Secondary | ICD-10-CM | POA: Diagnosis not present

## 2019-03-03 LAB — COMPREHENSIVE METABOLIC PANEL
ALT: 13 U/L (ref 0–44)
AST: 17 U/L (ref 15–41)
Albumin: 3.7 g/dL (ref 3.5–5.0)
Alkaline Phosphatase: 62 U/L (ref 38–126)
Anion gap: 2 — ABNORMAL LOW (ref 5–15)
BUN: 10 mg/dL (ref 6–20)
CO2: 22 mmol/L (ref 22–32)
Calcium: 8.9 mg/dL (ref 8.9–10.3)
Chloride: 110 mmol/L (ref 98–111)
Creatinine, Ser: 0.74 mg/dL (ref 0.44–1.00)
GFR calc Af Amer: >60 mL/min (ref >60)
GFR calc non Af Amer: >60 mL/min (ref >60)
Glucose, Bld: 96 mg/dL (ref 70–99)
Potassium: 4.2 mmol/L (ref 3.5–5.1)
Sodium: 134 mmol/L — ABNORMAL LOW (ref 135–145)
Total Bilirubin: 0.2 mg/dL — ABNORMAL LOW (ref 0.3–1.2)
Total Protein: 7 g/dL (ref 6.5–8.1)

## 2019-03-03 LAB — DIFFERENTIAL
Abs Immature Granulocytes: 0.01 10*3/uL (ref 0.00–0.07)
Basophils Absolute: 0 10*3/uL (ref 0.0–0.1)
Basophils Relative: 1 %
Eosinophils Absolute: 0.2 10*3/uL (ref 0.0–0.5)
Eosinophils Relative: 4 %
Immature Granulocytes: 0 %
Lymphocytes Relative: 24 %
Lymphs Abs: 1.3 10*3/uL (ref 0.7–4.0)
Monocytes Absolute: 0.4 10*3/uL (ref 0.1–1.0)
Monocytes Relative: 8 %
Neutro Abs: 3.3 10*3/uL (ref 1.7–7.7)
Neutrophils Relative %: 63 %

## 2019-03-03 LAB — CBC
HCT: 42 % (ref 36.0–46.0)
Hemoglobin: 13.3 g/dL (ref 12.0–15.0)
MCH: 30.5 pg (ref 26.0–34.0)
MCHC: 31.7 g/dL (ref 30.0–36.0)
MCV: 96.3 fL (ref 80.0–100.0)
Platelets: 307 10*3/uL (ref 150–400)
RBC: 4.36 MIL/uL (ref 3.87–5.11)
RDW: 12.3 % (ref 11.5–15.5)
WBC: 5.2 10*3/uL (ref 4.0–10.5)
nRBC: 0 % (ref 0.0–0.2)

## 2019-03-03 LAB — I-STAT CHEM 8, ED
BUN: 13 mg/dL (ref 6–20)
Calcium, Ion: 1.26 mmol/L (ref 1.15–1.40)
Chloride: 106 mmol/L (ref 98–111)
Creatinine, Ser: 0.7 mg/dL (ref 0.44–1.00)
Glucose, Bld: 92 mg/dL (ref 70–99)
HCT: 39 % (ref 36.0–46.0)
Hemoglobin: 13.3 g/dL (ref 12.0–15.0)
Potassium: 4.5 mmol/L (ref 3.5–5.1)
Sodium: 140 mmol/L (ref 135–145)
TCO2: 24 mmol/L (ref 22–32)

## 2019-03-03 LAB — APTT: aPTT: 27 seconds (ref 24–36)

## 2019-03-03 LAB — PROTIME-INR
INR: 0.9 (ref 0.8–1.2)
Prothrombin Time: 12.3 seconds (ref 11.4–15.2)

## 2019-03-03 LAB — I-STAT BETA HCG BLOOD, ED (MC, WL, AP ONLY): I-stat hCG, quantitative: <5 m[IU]/mL (ref <5)

## 2019-03-03 NOTE — ED Notes (Signed)
Patient transported to MRI 

## 2019-03-03 NOTE — ED Triage Notes (Signed)
Pt to ER brought in by significant other for difficulty speaking, generalized weakness, and new twitch to right eye. History of CVA per patient. LSN last night 11 PM. She is a/o x4.

## 2019-03-03 NOTE — Discharge Instructions (Signed)
Follow-up with Dr. Brett Fairy or your family doctor next week.  Make sure you take your Zoloft

## 2019-03-07 ENCOUNTER — Ambulatory Visit: Payer: 59 | Admitting: Physical Therapy

## 2019-03-07 NOTE — ED Provider Notes (Signed)
[facility] EMERGENCY DEPARTMENT Provider Note   CSN: [id number] Arrival date & time: 03/03/19  [id number]     History   Chief Complaint Chief Complaint  Patient presents with  . Aphasia    HPI Julie Warner is a 23 y.o. female.     Patient awoke today with slurred speech and right facial weakness.  Patient has not been taking her Zoloft for a number days.  The history is provided by the patient. No language interpreter was used.  Weakness Severity:  Mild Onset quality:  Sudden Timing:  Constant Progression:  Waxing and waning Chronicity:  New Context: not alcohol use   Relieved by:  Nothing Worsened by:  Nothing Ineffective treatments:  None tried Associated symptoms: no abdominal pain, no chest pain, no cough, no diarrhea, no frequency, no headaches and no seizures     Past Medical History:  Diagnosis Date  . Allergy    seasonal   . Anxiety   . Depression     Patient Active Problem List   Diagnosis Date Noted  . Left anterior cruciate ligament tear   . Chronic pain of left knee 01/18/2018    Past Surgical History:  Procedure Laterality Date  . ANTERIOR CRUCIATE LIGAMENT REPAIR Left 11/30/2018   Procedure: LEFT KNEE ANTERIOR CRUCIATE LIGAMENT (ACL) RECONSTRUCTION;  Surgeon: Meredith Pel, MD;  Location: [facility];  Service: Orthopedics;  Laterality: Left;  . NO PAST SURGERIES       OB History   No obstetric history on file.      Home Medications    Prior to Admission medications   Medication Sig Start Date End Date Taking? Authorizing Provider  ALPRAZolam (XANAX) 0.25 MG tablet Take 0.25 mg by mouth 2 (two) times daily as needed for anxiety.  09/09/17  Yes [provider]  norethindrone-ethinyl estradiol-iron (MICROGESTIN FE,GILDESS FE,LOESTRIN FE) 1.5-30 MG-MCG tablet Take 1 tablet by mouth daily.   Yes [provider]  sertraline (ZOLOFT) 50 MG tablet Take 50 mg by mouth daily. 11/16/17  Yes [provider]     Family History No family history on file.  Social History Social History   Tobacco Use  . Smoking status: Never Smoker  . Smokeless tobacco: Never Used  Substance Use Topics  . Alcohol use: No  . Drug use: No     Allergies   Lexapro [escitalopram oxalate]   Review of Systems Review of Systems  Constitutional: Negative for appetite change and fatigue.  HENT: Negative for congestion, ear discharge and sinus pressure.        Facial weakness and slurred speech  Eyes: Negative for discharge.  Respiratory: Negative for cough.   Cardiovascular: Negative for chest pain.  Gastrointestinal: Negative for abdominal pain and diarrhea.  Genitourinary: Negative for frequency and hematuria.  Musculoskeletal: Negative for back pain.  Skin: Negative for rash.  Neurological: Positive for weakness. Negative for seizures and headaches.  Psychiatric/Behavioral: Negative for hallucinations.     Physical Exam Updated Vital Signs BP (!) 105/57 (BP Location: Right Arm)   Pulse 66   Temp 98.9 F (37.2 C) (Oral)   Resp 18   LMP 03/01/2019   SpO2 99%   Physical Exam Vitals signs and nursing note reviewed.  Constitutional:      Appearance: She is well-developed.  HENT:     Head: Normocephalic.     Nose: Nose normal.     Mouth/Throat:     Mouth: Mucous membranes are moist.  Eyes:  General: No scleral icterus.    Conjunctiva/sclera: Conjunctivae normal.  Neck:     Musculoskeletal: Neck supple.     Thyroid: No thyromegaly.  Cardiovascular:     Rate and Rhythm: Normal rate and regular rhythm.     Heart sounds: No murmur. No friction rub. No gallop.   Pulmonary:     Breath sounds: No stridor. No wheezing or rales.  Chest:     Chest wall: No tenderness.  Abdominal:     General: There is no distension.     Tenderness: There is no abdominal tenderness. There is no rebound.  Musculoskeletal: Normal range of motion.  Lymphadenopathy:     Cervical: No cervical adenopathy.   Skin:    Findings: No erythema or rash.  Neurological:     Mental Status: She is alert and oriented to person, place, and time.     Motor: No abnormal muscle tone.     Coordination: Coordination normal.     Comments: Patient has facial weakness on the right and some slurred speech.  Almost looks like she has a tic  Psychiatric:        Behavior: Behavior normal.      ED Treatments / Results  Labs (all labs ordered are listed, but only abnormal results are displayed) Labs Reviewed  COMPREHENSIVE METABOLIC PANEL - Abnormal; Notable for the following components:      Result Value   Sodium 134 (*)    Total Bilirubin 0.2 (*)    Anion gap 2 (*)    All other components within normal limits  PROTIME-INR  APTT  CBC  DIFFERENTIAL  I-STAT CHEM 8, ED  CBG MONITORING, ED  I-STAT BETA HCG BLOOD, ED ([facility], [facility], [facility] ONLY)    EKG EKG Interpretation  Date/Time:  Thursday March 03 2019 09:53:07 EDT Ventricular Rate:  69 PR Interval:  124 QRS Duration: 72 QT Interval:  374 QTC Calculation: 400 R Axis:   80 Text Interpretation: Normal sinus rhythm with sinus arrhythmia Normal ECG Confirmed by Milton Ferguson [phone]) on 03/03/2019 12:59:18 PM   Radiology No results found.  Procedures Procedures (including critical care time)  Medications Ordered in ED Medications - No data to display   Initial Impression / Assessment and Plan / ED Course  I have reviewed the triage vital signs and the nursing notes.  Pertinent labs & imaging results that were available during my care of the patient were reviewed by me and considered in my medical decision making (see chart for details).        Patient had normal CT scan and lab work.  I spoke with neurology and they recommend get an MRI.  With a normal MRI neurology stated that she needed to start back up on her Zoloft and follow-up with her primary care doctor.  Patient was referred back to her primary care doctor and to neurology  Final  Clinical Impressions(s) / ED Diagnoses   Final diagnoses:  Dysarthria    ED Discharge Orders    None       Milton Ferguson, MD 03/07/19 1018

## 2019-03-09 ENCOUNTER — Ambulatory Visit: Payer: 59 | Admitting: Physical Therapy

## 2019-03-10 DIAGNOSIS — F959 Tic disorder, unspecified: Secondary | ICD-10-CM | POA: Diagnosis not present

## 2019-03-10 DIAGNOSIS — F321 Major depressive disorder, single episode, moderate: Secondary | ICD-10-CM | POA: Diagnosis not present

## 2019-03-10 DIAGNOSIS — F419 Anxiety disorder, unspecified: Secondary | ICD-10-CM | POA: Diagnosis not present

## 2019-03-11 MED FILL — SERTRALINE HCL 50 MG TABLET: 50 | 90 days supply | Qty: 90 | Fill #1

## 2019-03-14 ENCOUNTER — Ambulatory Visit: Payer: 59 | Attending: Orthopedic Surgery | Admitting: Physical Therapy

## 2019-03-14 ENCOUNTER — Telehealth: Payer: Self-pay | Admitting: Physical Therapy

## 2019-03-14 NOTE — Telephone Encounter (Signed)
Left voice mail regarding no show to appointment today. Left next appointment time and asked her to call if she needs to cancel or reschedule.

## 2019-03-16 ENCOUNTER — Ambulatory Visit: Payer: 59 | Admitting: Physical Therapy

## 2019-03-21 ENCOUNTER — Ambulatory Visit: Payer: 59 | Admitting: Orthopedic Surgery

## 2019-03-23 ENCOUNTER — Ambulatory Visit: Payer: 59 | Admitting: Physical Therapy

## 2019-03-23 ENCOUNTER — Encounter: Payer: 59 | Admitting: Physical Therapy

## 2019-03-24 ENCOUNTER — Ambulatory Visit: Payer: 59 | Admitting: Physical Therapy

## 2019-04-22 MED FILL — ALPRAZolam 0.25 MG TABS: 0.25 | 15 days supply | Qty: 30 | Fill #0

## 2019-04-22 MED FILL — LARIN FE 1.5-30 TABLET: 1.5-30 | 84 days supply | Qty: 84 | Fill #0

## 2019-05-04 ENCOUNTER — Ambulatory Visit (INDEPENDENT_AMBULATORY_CARE_PROVIDER_SITE_OTHER): Payer: 59 | Admitting: Neurology

## 2019-05-04 ENCOUNTER — Other Ambulatory Visit: Payer: Self-pay

## 2019-05-04 ENCOUNTER — Encounter: Payer: Self-pay | Admitting: Neurology

## 2019-05-04 VITALS — BP 114/70 | HR 78 | Temp 98.2°F | Ht 60.0 in | Wt 134.0 lb

## 2019-05-04 DIAGNOSIS — F952 Tourette's disorder: Secondary | ICD-10-CM | POA: Diagnosis not present

## 2019-05-04 MED ORDER — PIMOZIDE 2 MG PO TABS: 2.0000 mg | ORAL_TABLET | Freq: Two times a day (BID) | ORAL | 1 refills | Status: DC | PRN

## 2019-05-04 NOTE — Progress Notes (Signed)
Provider:  Larey Seat, M D  Referring Provider: [facility] Primary Care Physician:  [facility]  Chief Complaint  Patient presents with  . Follow-up    pt alone, rm 10. pt states that she has noticed a stutter for about 2 mths and difficulty with getting words out. she has a tic that is present as well    HPI:  Julie Warner is a 23 y.o. caucasian , right -handed  female patient and seen here on 05-04-2019  Upon referral  from Dr. ED. The patient reports that during her middle school years she first developed a motor tic it was a repetitive pulling and movement of the right shoulder but also associated with turning her neck to the right.  She demonstrated this for me here and at the time primary care did not see a need to treat this or follow-up. The tics became milder but they were never resolved.  Over the years she had been placed on Zoloft she was diagnosed with depression.  She discontinued her Zoloft or run out, and then presented to the emergency room visit at time of 48 hours later after her last Zoloft dose showing twitching movements myoclonic jerks reported complex motor tics incessantly movement of the upper extremities, she also taps her left foot, her bed but was most worrisome and led to the ER visit was a slurring of speech at the time.  She could not bring out words -she could just not say what she wanted to say, bringing the words out was difficult.  There is a stammer noted today may be a light stutter which is not uncommon in a motor tic, she does not have a verbal tic however but she has had vocalizations with her take.  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 She also reports that the tics as are worse when she is in a environment that makes her feel self-conscious, a lot of people focus on her or look at her. When she is at home by herself her tics are much less violent, she has never  fallen, has not fallen out of bed at night, she has no history of seizures or loss of awareness, her movements however are involuntary.  She is not aphasic. The patient is married. 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    Review of Systems: Out of a complete 14 system review, the patient complains of only the following symptoms, and all other reviewed systems are negative.    Stammering, stutter, pressed vocalization, non verbal-  Complex repeated motor tics.    Gilles de la Tourette Syndrome-  no family history.     Social History   Socioeconomic History  . Marital status: Married    Spouse name: Not on file  . Number of children: Not on file  . Years of education: Not on file  . Highest education level: Not on file  Occupational History  . Not on file  Tobacco Use  . Smoking status: Never Smoker  . Smokeless tobacco: Never Used  Substance and Sexual Activity  . Alcohol use: No  . Drug use: No  . Sexual activity: Not on file  Other Topics Concern  . Not on file  Social History Narrative  . Not on file   Social Determinants of Health   Financial Resource Strain:   . Difficulty of Paying  Living Expenses: Not on file  Food Insecurity:   . Worried About Charity fundraiser in the Last Year: Not on file  . Ran Out of Food in the Last Year: Not on file  Transportation Needs:   . Lack of Transportation (Medical): Not on file  . Lack of Transportation (Non-Medical): Not on file  Physical Activity:   . Days of Exercise per Week: Not on file  . Minutes of Exercise per Session: Not on file  Stress:   . Feeling of Stress : Not on file  Social Connections:   . Frequency of Communication with Friends and Family: Not on file  . Frequency of Social Gatherings with Friends and Family: Not on file  . Attends Religious Services: Not on file  . Active Member of Clubs or Organizations: Not on file  . Attends Archivist Meetings: Not on file  .  Marital Status: Not on file  Intimate Partner Violence:   . Fear of Current or Ex-Partner: Not on file  . Emotionally Abused: Not on file  . Physically Abused: Not on file  . Sexually Abused: Not on file    No family history on file.  Past Medical History:  Diagnosis Date  . Allergy    seasonal   . Anxiety   . Depression     Past Surgical History:  Procedure Laterality Date  . ANTERIOR CRUCIATE LIGAMENT REPAIR Left 11/30/2018   Procedure: LEFT KNEE ANTERIOR CRUCIATE LIGAMENT (ACL) RECONSTRUCTION;  Surgeon: Meredith Pel, MD;  Location: [place];  Service: Orthopedics;  Laterality: Left;  . NO PAST SURGERIES      Current Outpatient Medications  Medication Sig Dispense Refill  . ALPRAZolam (XANAX) 0.25 MG tablet Take 0.25 mg by mouth 2 (two) times daily as needed for anxiety.   0  . norethindrone-ethinyl estradiol-iron (MICROGESTIN FE,GILDESS FE,LOESTRIN FE) 1.5-30 MG-MCG tablet Take 1 tablet by mouth daily.    . sertraline (ZOLOFT) 50 MG tablet Take 50 mg by mouth daily.  2   No current facility-administered medications for this visit.    Allergies as of 05/04/2019 - Review Complete 05/04/2019  Allergen Reaction Noted  . Lexapro [escitalopram oxalate] Other (See Comments) 12/08/2017    Vitals: BP 114/70   Pulse 78   Temp 98.2 F (36.8 C)   Ht 5' (1.524 m)   Wt 134 lb (60.8 kg)   BMI 26.17 kg/m  Last Weight:  Wt Readings from Last 1 Encounters:  05/04/19 134 lb (60.8 kg)   Last Height:   Ht Readings from Last 1 Encounters:  05/04/19 5' (1.524 m)    Physical exam:  General: The patient is awake, alert and appears not in acute distress. The patient is well groomed. Head: Normocephalic, atraumatic. Neck is supple. Mallampati: 2 neck circumference: 14 inches.  Cardiovascular:  Regular rate and rhythm, without  murmurs or carotid bruit, and without distended neck veins. Respiratory: Lungs are clear to auscultation. Skin:  Without evidence of edema, or  rash Trunk: BMI  26 kg/m2  Neurologic exam : The patient is awake and alert, oriented to place and time.  Memory subjective described as intact. There is a normal attention span & concentration ability. Speech is fluent with  Dysarthria - stammering, stutter and pressed speech- dysphonia - not  aphasia. Mood and affect are appropriate.  Cranial nerves: Pupils are equal and briskly reactive to light. Funduscopic exam without  evidence of pallor or edema. Extraocular movements  in vertical and horizontal  planes intact and without nystagmus.  Visual fields by finger perimetry are intact. Hearing to finger rub intact.   Facial sensation intact to fine touch. Facial motor strength is symmetric and tongue and uvula move midline. Tongue protrusion into either cheek is normal. Shoulder shrug is normal.   Motor exam: Normal tone ,muscle bulk and symmetric  strength in all extremities. The patient was presenting here while having ,motor tics, repeatedly.   Sensory:  Fine touch, pinprick and vibration were tested in all extremities. Proprioception was normal.  Coordination: Rapid alternating movements in the fingers/hands were normal.  Finger-to-nose maneuver  normal without evidence of ataxia, dysmetria or tremor.  Gait and station: Patient walks without assistive device and is able unassisted to climb up to the exam table. Strength within normal limits. Stance is stable and normal. Tandem gait is unfragmented.  Romberg testing is negative   Deep tendon reflexes: in the upper and lower extremities are symmetric and intact. Babinski maneuver response deferred.    MRI brain in ED was negative- reviewed images here.  CT head was negative -  Lab-   Ref Range & Units 2 mo ago 5 mo ago  Sodium 135 - 145 mmol/L 134Low   136   Potassium 3.5 - 5.1 mmol/L 4.2  4.7   Chloride 98 - 111 mmol/L 110  106   CO2 22 - 32 mmol/L 22  21Low    Glucose, Bld 70 - 99 mg/dL 96  89   BUN 6 - 20 mg/dL 10  9    Creatinine, Ser 0.44 - 1.00 mg/dL 0.74  0.81   Calcium 8.9 - 10.3 mg/dL 8.9  9.3   Total Protein 6.5 - 8.1 g/dL 7.0    Albumin 3.5 - 5.0 g/dL 3.7    AST 15 - 41 U/L 17    ALT 0 - 44 U/L 13    Alkaline Phosphatase 38 - 126 U/L 62    Total Bilirubin 0.3 - 1.2 mg/dL 0.2Low     GFR calc non Af Amer >60 mL/min >60  >60   GFR calc Af Amer >60 mL/min >60  >60   Anion gap       Assessment:  After physical and neurologic examination, review of laboratory studies, imaging, neurophysiology testing and pre-existing records, assessment is that of :  INVOLUNTARY MOVEMENTS / Complex MOTOR TICS WITH AND WITHOUT SIMPLE VOCAL TICS/  No VERBALIZATION: suspect Tourettes syndrome.  Plan:  Treatment plan and additional workup :  Treatment with Orap, Haldol or similar has been a classic approach - she is too young and of childbearing age  to start on these. Not safe for BP medication such as guafensin-    Cognitive Behaviour therapy as desired. This is meant to help to cope with the  disorder.    She may try ORAP low dose for times of tic exacerbation.    Asencion Partridge Belicia Difatta MD 05/04/2019

## 2019-05-04 NOTE — Patient Instructions (Signed)
Tic Disorders A tic disorder is a condition in which a person makes sudden and repeated movements or sounds (tics). There are three types of tic disorders:  Transient or provisional tic disorder (common). This type usually goes away within a year or two.  Chronic or persistent tic disorder. This type may last all through childhood and continue into the adult years.  Tourette syndrome (rare). This type lasts through all of life. It often occurs with other disorders. Tic disorders starts before age 23, usually between age of 23 and 23. These disorders cannot be cured, but there are many treatments that can help manage tics. Most tic disorders get better over time. What are the causes? The cause of this condition is not known. What are the signs or symptoms? The main symptom of this condition is experiencing tics. There are four type of tics:  Simple motor tics. These are movements in one area of the body.  Complex motor tics. These are movements in large areas or in several areas of the body.  Simple vocal tics. These are single sounds.  Complex vocal tics. These are sounds that include several words or phrases. Tics range in severity and may be more severe when you are stressed or tired. Tics can change over time. Symptoms of simple motor tics  Blinking, squinting, or eyebrow raising.  Nose wrinkling.  Mouth twitching, grimacing, or making tongue movements.  Head nodding or twisting.  Shoulder shrugging.  Arm jerking.  Foot shaking. Symptoms of complex motor tics  Grooming behavior, such as combing one's hair.  Smelling objects.  Jumping.  Imitating others' behavior.  Making rude or obscene gestures. Symptoms of simple vocal tics  Coughing.  Humming.  Throat clearing.  Grunting.  Yawning.  Sniffing.  Barking.  Snorting. Symptoms of complex vocal tics  Imitating what others say.  Saying words and sentences that may: ? Seem out of context. ? Be  rude. How is this diagnosed? This condition is diagnosed based on:  Your symptoms.  Your medical history.  A physical exam.  An exam of your nervous system (neurological exam).  Tests. These may be done to rule out other conditions that cause symptoms like tics. Tests may include: ? Blood tests. ? Brain imaging tests. Your health care provider will ask you about:  The type of tics you have.  When the tics started and how often they happen.  How the tics affect your daily activities.  Other medical issues you may have.  Whether you take over-the-counter or prescription medicines.  Whether you use any drugs. You may be referred to a brain and nerve specialist (neurologist) or a mental health specialist for further evaluation. How is this treated? Treatment for this condition depends on how severe your tics are. If they are mild, you may not need treatment. If they are more severe, you may benefit from treatment. Some treatments include:  Cognitive behavioral therapy. This kind of therapy involves talking to a mental health professional. The therapist can help you to: ? Become more aware of your tics. ? Learn ways to control your tics. ? Know how to disguise your tics.  Family therapy. This kind of therapy provides education and emotional support for your family members.  Medicine that helps to control tics.  Medicine that is injected into the body to relax muscles (botulinum toxin). This may be a treatment option if your tics are severe.  Electrical stimulation of the brain (deep brain stimulation). This may be a treatment  option if your tics are severe. Follow these instructions at home:  Take over-the-counter and prescription medicines only as told by your health care provider.  Check with your health care provider before using any new prescription or over-the-counter medicines.  Keep all follow-up visits as told by your health care provider. This is  important. Contact a health care provider if:  You are not able to take your medicines as prescribed.  Your symptoms get worse.  Your symptoms are interfering with your ability to function normally at home, work, or school.  You have new or unusual symptoms like pain or weakness.  Your symptoms make you feel depressed or anxious. Summary  A tic disorder is a condition in which a person makes sudden and repeated movements or sounds.  Tic disorders start before age 23, usually between the age of 23 and 23.  Many tic disorders are mild and do not need treatment.  These disorders cannot be cured, but there are many treatments that can help manage tics. This information is not intended to replace advice given to you by your health care provider. Make sure you discuss any questions you have with your health care provider. Document Released: 12/22/2012 Document Revised: 04/03/2017 Document Reviewed: 05/09/2016 Elsevier Patient Education  2020 [employer].

## 2019-07-04 MED FILL — SERTRALINE HCL 50 MG TABLET: 50 | 90 days supply | Qty: 90 | Fill #0

## 2019-07-04 MED FILL — LARIN FE 1.5-30 TABLET: 1.5-30 | 84 days supply | Qty: 84 | Fill #0

## 2019-07-05 MED FILL — ALPRAZolam 0.25 MG TABS: 0.25 | 30 days supply | Qty: 30 | Fill #0

## 2019-07-19 ENCOUNTER — Encounter (HOSPITAL_COMMUNITY): Payer: Self-pay | Admitting: Psychiatry

## 2019-07-19 ENCOUNTER — Other Ambulatory Visit: Payer: Self-pay

## 2019-07-19 ENCOUNTER — Observation Stay (HOSPITAL_COMMUNITY)
Admission: RE | Admit: 2019-07-19 | Discharge: 2019-07-20 | Disposition: A | Discharge status: Home / Self Care | Payer: 59 | Attending: Psychiatry | Admitting: Psychiatry

## 2019-07-19 DIAGNOSIS — F339 Major depressive disorder, recurrent, unspecified: Principal | ICD-10-CM | POA: Insufficient documentation

## 2019-07-19 DIAGNOSIS — F3342 Major depressive disorder, recurrent, in full remission: Secondary | ICD-10-CM

## 2019-07-19 DIAGNOSIS — Z20822 Contact with and (suspected) exposure to covid-19: Secondary | ICD-10-CM | POA: Insufficient documentation

## 2019-07-19 DIAGNOSIS — F952 Tourette's disorder: Secondary | ICD-10-CM | POA: Diagnosis not present

## 2019-07-19 DIAGNOSIS — Z888 Allergy status to other drugs, medicaments and biological substances status: Secondary | ICD-10-CM | POA: Diagnosis not present

## 2019-07-19 DIAGNOSIS — F419 Anxiety disorder, unspecified: Secondary | ICD-10-CM | POA: Diagnosis not present

## 2019-07-19 DIAGNOSIS — F321 Major depressive disorder, single episode, moderate: Secondary | ICD-10-CM | POA: Diagnosis not present

## 2019-07-19 DIAGNOSIS — R440 Auditory hallucinations: Secondary | ICD-10-CM | POA: Insufficient documentation

## 2019-07-19 DIAGNOSIS — R45851 Suicidal ideations: Secondary | ICD-10-CM | POA: Diagnosis not present

## 2019-07-19 DIAGNOSIS — F959 Tic disorder, unspecified: Secondary | ICD-10-CM | POA: Diagnosis not present

## 2019-07-19 DIAGNOSIS — R441 Visual hallucinations: Secondary | ICD-10-CM | POA: Diagnosis not present

## 2019-07-19 DIAGNOSIS — R443 Hallucinations, unspecified: Secondary | ICD-10-CM | POA: Diagnosis not present

## 2019-07-19 DIAGNOSIS — Z79899 Other long term (current) drug therapy: Secondary | ICD-10-CM | POA: Diagnosis not present

## 2019-07-19 DIAGNOSIS — F330 Major depressive disorder, recurrent, mild: Secondary | ICD-10-CM

## 2019-07-19 DIAGNOSIS — Z915 Personal history of self-harm: Secondary | ICD-10-CM | POA: Insufficient documentation

## 2019-07-19 LAB — RESPIRATORY PANEL BY RT PCR (FLU A&B, COVID)
Influenza A by PCR: NEGATIVE
Influenza B by PCR: NEGATIVE
SARS Coronavirus 2 by RT PCR: NEGATIVE

## 2019-07-19 NOTE — BH Assessment (Addendum)
Assessment Note  Julie Warner is an 24 y.o. female presenting voluntarily as walk-in to [facility] due to complaints of SI and worsening depression. Patient denied HI. Patient reported onset of SI was 2 months ago. Patient reported stressor/trigger of depression and SI is when she started showing more aggressive tourette's symptoms in 02/2019. Patient reported "things I was comfortable doing, I no longer do them due to not wanting to embarrass myself or someone else and I just didn't want to feel uncomfortable". Patient reported increased depressive symptoms, isolation, crying spells, guilt, irritability, increased anxiety, feelings of guilt and worthlessness. Patient reported hallucinations, seeing random objects that are not really there and hearing talking but not understanding what is being said. Patient also reported difficulty getting out of bed. Patient reported sleeping anywhere from 2-10 hours.   Patient reported 1x past suicide attempt around 31 or 24 years old by cutting her arm. Patient reported no prior inpatient psych treatment. Patient is not receiving any outpatient mental health services. Patient received medication management through her PCP. Patient reported feeling that medications were not working.   Patient and husband reside together. Patient reported being married for 3 years. 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 Patient reported parents, husband and sister are very supportive. Patient was pleasant and cooperative during assessment.  Collateral Contact: Bobetta Lime, mother, [phone], permission give by patient for additional information. Mother stated patient had a breakdown yesterday and shared that she was having suicidal thoughts. Mother shared "there is no reasoning with her, she wont tell us everything and she is not taking her medicines because she said it may maker her tourette's symptoms worse. Mother shared her  concern that patient may cause harm to herself and answered "I don't know", when asked do you think she would hurt herself.   Diagnosis: Major depressive disorder  Past Medical History:  Past Medical History:  Diagnosis Date  . Allergy    seasonal   . Anxiety   . Depression     Past Surgical History:  Procedure Laterality Date  . ANTERIOR CRUCIATE LIGAMENT REPAIR Left 11/30/2018   Procedure: LEFT KNEE ANTERIOR CRUCIATE LIGAMENT (ACL) RECONSTRUCTION;  Surgeon: Meredith Pel, MD;  Location: [place];  Service: Orthopedics;  Laterality: Left;  . NO PAST SURGERIES      Family History: No family history on file.  Social History:  reports that she has never smoked. She has never used smokeless tobacco. She reports that she does not drink alcohol or use drugs.  Additional Social History:  Alcohol / Drug Use Pain Medications: see MAR Prescriptions: see MAR Over the Counter: see MAR  CIWA:   COWS:    Allergies:  Allergies  Allergen Reactions  . Lexapro [Escitalopram Oxalate] Other (See Comments)    stroke    Home Medications:  Medications Prior to Admission  Medication Sig Dispense Refill  . ALPRAZolam (XANAX) 0.25 MG tablet Take 0.25 mg by mouth 2 (two) times daily as needed for anxiety.   0  . norethindrone-ethinyl estradiol-iron (MICROGESTIN FE,GILDESS FE,LOESTRIN FE) 1.5-30 MG-MCG tablet Take 1 tablet by mouth daily.    . pimozide (ORAP) 2 MG tablet Take 1 tablet (2 mg total) by mouth 2 (two) times daily as needed. 60 tablet 1  . sertraline (ZOLOFT) 50 MG tablet Take 50 mg by mouth daily.  2    OB/GYN Status:  No LMP recorded.  General Assessment Data Location of Assessment: [facility] Assessment Services TTS  Assessment: In system Is this a Tele or Face-to-Face Assessment?: Face-to-Face Is this an Initial Assessment or a Re-assessment for this encounter?: Initial Assessment Patient Accompanied by:: N/A Language Other than English: No Living Arrangements: (family  home) What gender do you identify as?: Female Marital status: Married Pregnancy Status: Unknown Living Arrangements: Spouse/significant other, Parent, Other relatives Can pt return to current living arrangement?: Yes Admission Status: Voluntary Is patient capable of signing voluntary admission?: Yes Referral Source: Self/Family/Friend     Crisis Care Plan Living Arrangements: Spouse/significant other, Parent, Other relatives Legal Guardian: Other:(self) Name of Psychiatrist: (none) Name of Therapist: (none)  Education Status Is patient currently in school?: No Is the patient employed, unemployed or receiving disability?: Employed  Risk to self with the past 6 months Suicidal Ideation: Yes-Currently Present Has patient been a risk to self within the past 6 months prior to admission? : No Suicidal Intent: Yes-Currently Present Has patient had any suicidal intent within the past 6 months prior to admission? : No Is patient at risk for suicide?: Yes Suicidal Plan?: No Has patient had any suicidal plan within the past 6 months prior to admission? : No Access to Means: No What has been your use of drugs/alcohol within the last 12 months?: (none) Previous Attempts/Gestures: Yes How many times?: (1x) Other Self Harm Risks: (none) Triggers for Past Attempts: Unknown Intentional Self Injurious Behavior: None Family Suicide History: No Recent stressful life event(s): Other (Comment)(symptoms of aggressive tourettes) Persecutory voices/beliefs?: No Depression: Yes Depression Symptoms: Insomnia, Tearfulness, Isolating, Fatigue, Guilt, Loss of interest in usual pleasures, Feeling worthless/self pity, Feeling angry/irritable Substance abuse history and/or treatment for substance abuse?: No Suicide prevention information given to non-admitted patients: Not applicable  Risk to Others within the past 6 months Homicidal Ideation: No Does patient have any lifetime risk of violence toward  others beyond the six months prior to admission? : No Thoughts of Harm to Others: No Current Homicidal Intent: No Current Homicidal Plan: No Access to Homicidal Means: No Identified Victim: (n/a) History of harm to others?: No Assessment of Violence: None Noted Violent Behavior Description: (n/a) Does patient have access to weapons?: Yes (Comment)("guns and weapons in the home") Criminal Charges Pending?: No Does patient have a court date: No Is patient on probation?: No  Psychosis Hallucinations: Auditory, Visual Delusions: None noted  Mental Status Report Appearance/Hygiene: Unremarkable Eye Contact: Good Motor Activity: Freedom of movement, Tics, Other (Comment)(tourettes) Speech: Logical/coherent, Soft Level of Consciousness: Alert Mood: Depressed Affect: Appropriate to circumstance, Depressed Anxiety Level: Moderate Thought Processes: Coherent, Relevant Judgement: Partial Orientation: Person, Place, Situation, Time Obsessive Compulsive Thoughts/Behaviors: None  Cognitive Functioning Concentration: Good Memory: Recent Intact Is patient IDD: No Insight: Fair Impulse Control: Fair Appetite: Fair Have you had any weight changes? : No Change Sleep: ("up and down") Total Hours of Sleep: (2-10) Vegetative Symptoms: Staying in bed, Decreased grooming  ADLScreening [facility] Assessment Services) Patient's cognitive ability adequate to safely complete daily activities?: Yes Patient able to express need for assistance with ADLs?: Yes Independently performs ADLs?: Yes (appropriate for developmental age)  Prior Inpatient Therapy Prior Inpatient Therapy: No  Prior Outpatient Therapy Prior Outpatient Therapy: No Does patient have an ACCT team?: No Does patient have Intensive In-House Services?  : No Does patient have [facility] services? : No Does patient have P4CC services?: No  ADL Screening (condition at time of admission) Patient's cognitive ability adequate to safely  complete daily activities?: Yes Patient able to express need for assistance with ADLs?: Yes Independently performs  ADLs?: Yes (appropriate for developmental age)  Disposition:  Disposition Initial Assessment Completed for this Encounter: Yes Disposition of Patient: Admit  Talbot Grumbling, NP, patient meets inpatient criteria. Patient admitted to [facility] Adult Unit.   On Site Evaluation by:   Reviewed with Physician:    Venora Maples 07/19/2019 9:00 PM

## 2019-07-19 NOTE — H&P (Signed)
Psychiatric Admission Assessment Adult  Patient Identification: Julie Warner MRN:  [id number] Date of Evaluation:  07/19/2019 Chief Complaint:  suicidal thoughts Principal Diagnosis: MDD (major depressive disorder) Diagnosis:  Principal Problem:   MDD (major depressive disorder)  History of Present Illness:   Julie Warner is a 24 y.o pt who presents voluntarily to [facility] accompanied by her mother for complaints of suicidal ideation. Pt reports she has tourette and her symptoms has been getting progressively worse over the past 2 months. Pt states "I feel very uncomfortable at places where I usually felt comfortable, I try to stay home so I don't become embarrassed or embarrass other people". Pt also reports difficulty getting out of bed due to no motivation. She states her depressive symptoms are anxiety, isolation, irritability, restfulness, guilt, worthlessness and hopelessness. Pt endorses SI with no specific plan. Endorses auditory and visual hallucination. Pt reports she sees movement and random objects; she hears voices talking but unable to understand what is being said. She states she has a history of self harm by cutting as a teenager. She had a past suicidal attempt at 22 or 17 by trying to cut her arms.  She denies HI. Julie Warner Pt sees no therapist or psychiatrist. She takes Zoloft and Xanax prescribed by her PCP. Pt has had no prior inpatient psychiatric admission. She denies any drug or alcohol use. Pt reports she has access to guns and weapons. She sleeps 2-10 hours daily and has a fair apetiete. 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 She reports that her parents, sister and husbands are supportive.  Pt states that she thinks her medication is not working effectively.   During evaluation pt was sitting; she is alert/oriented x 4; cooperative; and mood is anxious/depressed congruent with affect. Pt is speaking in a clear tone at decreased volume, and normal pace; with  good eye contact. Her thought process is coherent and relevant; There is no indication that she is currently responding to internal/external stimuli or experiencing delusional thought content. Pt's insight, judgement and impulse control is fair at this time.   Associated Signs/Symptoms: Depression Symptoms:  depressed mood, feelings of worthlessness/guilt, hopelessness, suicidal thoughts without plan, anxiety, (Hypo) Manic Symptoms:  Hallucinations, Irritable Mood,  Anxiety Symptoms:  Excessive Worry, Social Anxiety, Psychotic Symptoms:  Hallucinations: Auditory Visual PTSD Symptoms: NA Total Time spent with patient: 30 minutes    Past Psychiatric History: Yes  Is the patient at risk to self? Yes.    Has the patient been a risk to self in the past 6 months? No.  Has the patient been a risk to self within the distant past? No.  Is the patient a risk to others? No.  Has the patient been a risk to others in the past 6 months? No.  Has the patient been a risk to others within the distant past? No.   Prior Inpatient Therapy: Prior Inpatient Therapy: No Prior Outpatient Therapy: Prior Outpatient Therapy: No Does patient have an ACCT team?: No Does patient have Intensive In-House Services?  : No Does patient have [facility] services? : No Does patient have P4CC services?: No  Alcohol Screening:   Substance Abuse History in the last 12 months:  No. Consequences of Substance Abuse: NA Previous Psychotropic Medications: Yes  Psychological Evaluations: Yes  Past Medical History:  Past Medical History:  Diagnosis Date  . Allergy    seasonal   . Anxiety   . Depression  Past Surgical History:  Procedure Laterality Date  . ANTERIOR CRUCIATE LIGAMENT REPAIR Left 11/30/2018   Procedure: LEFT KNEE ANTERIOR CRUCIATE LIGAMENT (ACL) RECONSTRUCTION;  Surgeon: Meredith Pel, MD;  Location: [place];  Service: Orthopedics;  Laterality: Left;  . NO PAST SURGERIES     Family History:  No family history on file. Family Psychiatric  History: Unknown Tobacco Screening:   Social History:  Social History   Substance and Sexual Activity  Alcohol Use No     Social History   Substance and Sexual Activity  Drug Use No    Additional Social History: Marital status: Married    Pain Medications: see MAR Prescriptions: see MAR Over the Counter: see MAR                    Allergies:   Allergies  Allergen Reactions  . Lexapro [Escitalopram Oxalate] Other (See Comments)    stroke   Lab Results: No results found for this or any previous visit (from the past 89 hour(s)).  Blood Alcohol level:  No results found for: [facility]  Metabolic Disorder Labs:  No results found for: HGBA1C, MPG No results found for: PROLACTIN No results found for: CHOL, TRIG, HDL, CHOLHDL, VLDL, LDLCALC  Current Medications: No current facility-administered medications for this encounter.   PTA Medications: Medications Prior to Admission  Medication Sig Dispense Refill Last Dose  . ALPRAZolam (XANAX) 0.25 MG tablet Take 0.25 mg by mouth 2 (two) times daily as needed for anxiety.   0   . norethindrone-ethinyl estradiol-iron (MICROGESTIN FE,GILDESS FE,LOESTRIN FE) 1.5-30 MG-MCG tablet Take 1 tablet by mouth daily.     . pimozide (ORAP) 2 MG tablet Take 1 tablet (2 mg total) by mouth 2 (two) times daily as needed. 60 tablet 1   . sertraline (ZOLOFT) 50 MG tablet Take 50 mg by mouth daily.  2     Musculoskeletal: Strength & Muscle Tone: within normal limits Gait & Station: normal Patient leans: Right  Psychiatric Specialty Exam: Physical Exam  Constitutional: She is oriented to person, place, and time. She appears well-developed and well-nourished.  HENT:  Head: Normocephalic.  Eyes: Pupils are equal, round, and reactive to light.  Respiratory: Effort normal.  Musculoskeletal:        General: Normal range of motion.     Cervical back: Normal range of motion.  Neurological: She is  alert and oriented to person, place, and time.  Skin: Skin is warm and dry.  Psychiatric: Her speech is normal. Judgment normal. Her mood appears anxious. She is withdrawn. Cognition and memory are normal. She exhibits a depressed mood. She expresses suicidal ideation.    Review of Systems  Psychiatric/Behavioral: Positive for hallucinations, sleep disturbance and suicidal ideas. The patient is nervous/anxious.   All other systems reviewed and are negative.   There were no vitals taken for this visit.There is no height or weight on file to calculate BMI.  General Appearance: Casual  Eye Contact:  Poor  Speech:  Normal Rate  Volume:  Decreased  Mood:  Anxious, Depressed, Hopeless, Irritable and Worthless  Affect:  Congruent  Thought Process:  Coherent and Descriptions of Associations: Intact  Orientation:  Full (Time, Place, and Person)  Thought Content:  Hallucinations: Auditory Visual  Suicidal Thoughts:  Yes.  without intent/plan  Homicidal Thoughts:  No  Memory:  Recent;   Good  Judgement:  Fair  Insight:  Fair  Psychomotor Activity:  Tremor and Pt has tourette's   Concentration:  Concentration: Good  Recall:  Good  Fund of Knowledge:  Good  Language:  Good  Akathisia:  No  Handed:  Right  AIMS (if indicated):     Assets:  Communication Skills Desire for Improvement Financial Resources/Insurance Housing Intimacy Talents/Skills Vocational/Educational  ADL's:  Intact  Cognition:  WNL  Sleep:      Disposition: Recommend psychiatric Inpatient admission when medically cleared. Supportive therapy provided about ongoing stressors.   Treatment Plan Summary: Daily contact with patient to assess and evaluate symptoms and progress in treatment and Medication management  Observation Level/Precautions:  15 minute checks  Laboratory:  Chemistry Profile UDS  Psychotherapy:    Medications:    Consultations:    Discharge Concerns:    Estimated LOS:  Other:     Physician  Treatment Plan for Primary Diagnosis: MDD (major depressive disorder) Long Term Goal(s): Improvement in symptoms so as ready for discharge  Short Term Goals: Ability to identify changes in lifestyle to reduce recurrence of condition will improve, Ability to verbalize feelings will improve, Ability to demonstrate self-control will improve, Ability to identify and develop effective coping behaviors will improve, Ability to maintain clinical measurements within normal limits will improve and Ability to identify triggers associated with substance abuse/mental health issues will improve  Physician Treatment Plan for Secondary Diagnosis: Principal Problem:   MDD (major depressive disorder)  Long Term Goal(s): Improvement in symptoms so as ready for discharge  Short Term Goals: Ability to identify changes in lifestyle to reduce recurrence of condition will improve, Ability to verbalize feelings will improve, Ability to demonstrate self-control will improve, Ability to identify and develop effective coping behaviors will improve, Ability to maintain clinical measurements within normal limits will improve and Ability to identify triggers associated with substance abuse/mental health issues will improve  I certify that inpatient services furnished can reasonably be expected to improve the patient's condition.    Mliss Fritz, NP 3/16/20219:23 PM

## 2019-07-19 NOTE — Progress Notes (Signed)
Patient ID: Julie Warner, female   DOB: May 08, 1995, 24 y.o.   MRN: [id number] Pt admitted to 400 hall awaiting bed on 300 hall. Pt cooperative but appears anxious. Pt reports worsening of Tourette Syndrome which embarrass her in public. Pt denies SI/HI at this time.

## 2019-07-19 NOTE — Plan of Care (Signed)
[facility] Observation Crisis Plan  Reason for Crisis Plan:  Crisis Stabilization   Plan of Care:  Referral for Inpatient Hospitalization  Family Support:      Current Living Environment:  Living Arrangements: Spouse/significant other, Parent, Other relatives  Insurance:   Hospital Account    Name Acct ID Class Status Primary Coverage   Julie Warner, Julie Warner [id number] [facility] UMR        Guarantor Account (for Hospital Account [id number])    Name Relation to Pt Service Area Active? Acct Type   Julie Warner Self [facility] Yes Behavioral Health   Address Phone       [address] [place], [place] [address] [phone])          Coverage Information (for Hospital Account [id number])    F/O Payor/Plan Precert #   [employer]OY[employer]/ [employer]    Subscriber Subscriber #   Julie Warner [id number]   Address Phone   [address] [place] [address] [phone]      Legal Guardian:  Legal Guardian: Other:(self)  Primary Care Provider:  [facility]  Current Outpatient Providers:  PA, [facility]  Psychiatrist:  Name of Psychiatrist: (none)  Counselor/Therapist:  Name of Therapist: (none)  Compliant with Medications:  No  Additional Information:   Julie Warner 3/16/202110:06 PM

## 2019-07-19 NOTE — Progress Notes (Signed)
Patient ID: Julie Warner, female   DOB: Jul 30, 1995, 24 y.o.   MRN: [id number] Pt A&O x 4, presents with SI, no specific plan, but pt has access to guns in home.  Previous SI attempt noted.  Pt reports Tourette Syndrome has worsened and she doesn't want to embarrass herself or the family.  Pt admits to hearing voices.  Denies HI or visual hallucinations.  Skin search completed.  Monitoring for safety.  Pt calm & cooperative, depressed affect.

## 2019-07-20 DIAGNOSIS — F333 Major depressive disorder, recurrent, severe with psychotic symptoms: Secondary | ICD-10-CM

## 2019-07-20 DIAGNOSIS — Z20822 Contact with and (suspected) exposure to covid-19: Secondary | ICD-10-CM | POA: Diagnosis not present

## 2019-07-20 DIAGNOSIS — Z915 Personal history of self-harm: Secondary | ICD-10-CM | POA: Diagnosis not present

## 2019-07-20 DIAGNOSIS — R440 Auditory hallucinations: Secondary | ICD-10-CM | POA: Diagnosis not present

## 2019-07-20 DIAGNOSIS — Z888 Allergy status to other drugs, medicaments and biological substances status: Secondary | ICD-10-CM | POA: Diagnosis not present

## 2019-07-20 DIAGNOSIS — F339 Major depressive disorder, recurrent, unspecified: Secondary | ICD-10-CM | POA: Diagnosis not present

## 2019-07-20 DIAGNOSIS — R441 Visual hallucinations: Secondary | ICD-10-CM | POA: Diagnosis not present

## 2019-07-20 DIAGNOSIS — F419 Anxiety disorder, unspecified: Secondary | ICD-10-CM | POA: Diagnosis not present

## 2019-07-20 DIAGNOSIS — F952 Tourette's disorder: Secondary | ICD-10-CM | POA: Diagnosis not present

## 2019-07-20 DIAGNOSIS — R45851 Suicidal ideations: Secondary | ICD-10-CM | POA: Diagnosis not present

## 2019-07-20 MED ORDER — ARIPIPRAZOLE 2 MG PO TABS: 2.0000 mg | ORAL_TABLET | Freq: Every day | ORAL | 1 refills | Status: DC

## 2019-07-20 MED ORDER — SERTRALINE HCL 100 MG PO TABS: 100.0000 mg | ORAL_TABLET | Freq: Every day | ORAL | 1 refills | Status: DC

## 2019-07-20 MED ORDER — ALUM & MAG HYDROXIDE-SIMETH 200-200-20 MG/5ML PO SUSP: 30.0000 mL | ORAL | Status: DC | PRN

## 2019-07-20 MED ORDER — TRAZODONE HCL 50 MG PO TABS: 50.0000 mg | ORAL_TABLET | Freq: Every evening | ORAL | Status: DC | PRN

## 2019-07-20 MED ORDER — MAGNESIUM HYDROXIDE 400 MG/5ML PO SUSP: 30.0000 mL | Freq: Every day | ORAL | Status: DC | PRN

## 2019-07-20 MED ORDER — ENBRACE HR PO CAPS: 1.0000 | ORAL_CAPSULE | Freq: Every day | ORAL | 11 refills | Status: DC

## 2019-07-20 MED ORDER — HYDROXYZINE HCL 25 MG PO TABS: 25.0000 mg | ORAL_TABLET | Freq: Three times a day (TID) | ORAL | Status: DC | PRN

## 2019-07-20 MED ORDER — ACETAMINOPHEN 325 MG PO TABS: 650.0000 mg | ORAL_TABLET | Freq: Four times a day (QID) | ORAL | Status: DC | PRN

## 2019-07-20 NOTE — BH Assessment (Signed)
[facility] Assessment Progress Note  Per Johnn Hai, MD, this pt does not require psychiatric hospitalization at this time.  Pt is to be discharged from the [facility] Observation Unit with referral for an outpatient psychiatrist.  At pt's request, an intake appointment has been scheduled.  At 09:40 this Probation officer called the [facility] at [place] and spoke to [place].  She has scheduled pt for a virtual intake appointment on Tuesday, 08/09/2019 at 11:00 am.  This has been included in pt's discharge instructions.  Dr Jake Samples and pt's nurse, Pamala Hurry, have been notified.  Jalene Mullet, [place] Triage Specialist [phone]

## 2019-07-20 NOTE — Progress Notes (Signed)
Patient discharge home. Patient alert, oriented and ambulatory at discharge. Patient denies SI/HI and A/V hallucinations at discharge. AVS/Follow-UP/Prescriptions reviewed with patient and written copies given to patient and she verbalized understanding. All patient belongings returned to her at discharge. Patient escorted off unit by this writer to meet husband who picked her up.

## 2019-07-20 NOTE — Discharge Summary (Signed)
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  What brought her in was worsening depressive symptoms involving auditory hallucinations she feels that she hears voices "talking" and may see objects that are not actually there,  she also has Tourette's.  She has been on sertraline 50 mg a day and feels it is not as effective as it could be.  She is not suicidal now she states she has thoughts without plans or intent she understands what it means to contract for safety  Principal Problem: MDD (major depressive disorder) Discharge Diagnoses: Principal Problem:   MDD (major depressive disorder)   Past Psychiatric History: see above  Past Medical History:  Past Medical History:  Diagnosis Date  . Allergy    seasonal   . Anxiety   . Depression     Past Surgical History:  Procedure Laterality Date  . ANTERIOR CRUCIATE LIGAMENT REPAIR Left 11/30/2018   Procedure: LEFT KNEE ANTERIOR CRUCIATE LIGAMENT (ACL) RECONSTRUCTION;  Surgeon: Meredith Pel, MD;  Location: [place];  Service: Orthopedics;  Laterality: Left;  . NO PAST SURGERIES     Family History: History reviewed. No pertinent family history. Family Psychiatric  History: no new data Social History:  Social History   Substance and Sexual Activity  Alcohol Use No     Social History   Substance and Sexual Activity  Drug Use No    Social History   Socioeconomic History  . Marital status: Married    Spouse name: Not on file  . Number of children: Not on file  . Years of education: Not on file  . Highest  education level: Not on file  Occupational History  . Not on file  Tobacco Use  . Smoking status: Never Smoker  . Smokeless tobacco: Never Used  Substance and Sexual Activity  . Alcohol use: No  . Drug use: No  . Sexual activity: Not on file  Other Topics Concern  . Not on file  Social History Narrative  . Not on file   Social Determinants of Health   Financial Resource Strain:   . Difficulty of Paying Living Expenses:   Food Insecurity:   . Worried About Charity fundraiser in the Last Year:   . Arboriculturist in the Last Year:   Transportation Needs:   . Film/video editor (Medical):   [name] Lack of Transportation (Non-Medical):   Physical Activity:   . Days of Exercise per Week:   . Minutes of Exercise per Session:   Stress:   . Feeling of Stress :   Social Connections:   . Frequency of Communication with Friends and Family:   . Frequency of Social Gatherings with Friends and Family:   . Attends Religious Services:   . Active Member of Clubs or Organizations:   . Attends Archivist Meetings:   [name] Marital Status:     Hospital Course:   Patient was monitored overnight she displayed no dangerous behavior she was able to articulate that though she had passive  suicidal thoughts she had no plans or intent and she thought she would be safe at home with a medication adjustment and follow-up with her usual providers.  Again she seem very reliable and could contract and had no symptoms warranting inpatient care we did escalate sertraline to 100 mg a day, augmented with aripiprazole to help with Tourette's and depression, and augmented with B vitamins  Physical Findings: AIMS: Facial and Oral Movements Muscles of Facial Expression: None, normal Lips and Perioral Area: None, normal Jaw: None, normal Tongue: None, normal,Extremity Movements Upper (arms, wrists, hands, fingers): None, normal Lower (legs, knees, ankles, toes): None, normal, Trunk Movements Neck,  shoulders, hips: None, normal, Overall Severity Severity of abnormal movements (highest score from questions above): None, normal Incapacitation due to abnormal movements: None, normal Patient's awareness of abnormal movements (rate only patient's report): No Awareness, Dental Status Current problems with teeth and/or dentures?: No Does patient usually wear dentures?: No  CIWA:  CIWA-Ar Total: 0 COWS:  COWS Total Score: 0  Musculoskeletal: Strength & Muscle Tone: within normal limits Gait & Station: normal Patient leans: N/A  Psychiatric Specialty Exam: Physical Exam  Review of Systems  Blood pressure 118/75, pulse 78, temperature 98 F (36.7 C), temperature source Oral, resp. rate 16, height 5' (1.524 m), weight 66.7 kg.Body mass index is 28.71 kg/m.  General Appearance: Casual  Eye Contact:  Good  Speech:  Clear and Coherent  Volume:  Normal  Mood:  Dysphoric  Affect:  Appropriate  Thought Process:  Coherent and Goal Directed  Orientation:  Full (Time, Place, and Person)  Thought Content:  Logical  Suicidal Thoughts:  Yes.  without intent/plan  Homicidal Thoughts:  No  Memory:  Immediate;   Good Recent;   Good Remote;   Good  Judgement:  Intact  Insight:  Good  Psychomotor Activity:  tics  Concentration:  Concentration: Fair and Attention Span: Fair  Recall:  [employer] of Knowledge:  Fair  Language:  Fair  Akathisia:  Negative  Handed:  Right  AIMS (if indicated):     Assets:  Communication Skills Desire for Improvement  ADL's:  Intact  Cognition:  WNL  Sleep:           Has this patient used any form of tobacco in the last 30 days? (Cigarettes, Smokeless Tobacco, Cigars, and/or Pipes) Yes, No  Blood Alcohol level:  No results found for: [facility]  Metabolic Disorder Labs:  No results found for: HGBA1C, MPG No results found for: PROLACTIN No results found for: CHOL, TRIG, HDL, CHOLHDL, VLDL, LDLCALC  See Psychiatric Specialty Exam and Suicide Risk Assessment  completed by Attending Physician prior to discharge.  Discharge destination:  Home  Is patient on multiple antipsychotic therapies at discharge:  No   Has Patient had three or more failed trials of antipsychotic monotherapy by history:  No  Recommended Plan for Multiple Antipsychotic Therapies: NA   Allergies as of 07/20/2019      Reactions   Lexapro [escitalopram Oxalate] Other (See Comments)   stroke      Medication List    TAKE these medications     Indication  ALPRAZolam 0.25 MG tablet Commonly known as: XANAX Take 0.25 mg by mouth 2 (two) times daily as needed for anxiety.  Indication: Feeling Anxious   ARIPiprazole 2 MG tablet Commonly known as: Abilify Take 1 tablet (2 mg total) by mouth daily.  Indication: Tourette's   EnBrace HR Caps Take 1 capsule by mouth daily. May get  from [email or web address] if not covered  Indication: Deficiency of Folic Acid   norethindrone-ethinyl estradiol-iron 1.5-30 MG-MCG tablet Commonly known as: LOESTRIN FE Take 1 tablet by mouth daily.  Indication: Birth Control Treatment   sertraline 100 MG tablet Commonly known as: ZOLOFT Take 1 tablet (100 mg total) by mouth daily. What changed:   medication strength  how much to take  Indication: Major Depressive Disorder       Signed: Johnn Hai, MD 07/20/2019, 8:11 AM

## 2019-07-20 NOTE — Progress Notes (Signed)
Patient is alert, oriented and ambulatory. Patient is out of bed eating breakfast. Patient denies SI/HI and A/V hallucinations. Patient provided support and encouragement. Q 15 minute checks in progress and patient remains safe on unit. Monitoring continues.

## 2019-07-20 NOTE — Discharge Instructions (Addendum)
For your behavioral health needs, you are advised to follow up with an outpatient psychiatrist.  You are scheduled for a virtual intake appointment with Maudry Mayhew, MD on Tuesday, August 09, 2019 at 11:00 am.  They will be reaching out to you prior to the appointment to make arrangements, but if you do not hear from them in the next few days, call the office at the number indicated below:       [facility]. [address] [place], [place] [address]      [phone]

## 2019-07-21 MED FILL — SERTRALINE HCL 100 MG TAB: 100 | 90 days supply | Qty: 90 | Fill #0

## 2019-07-21 MED FILL — ARIPiprazole 2 MG TABS: 2 | 90 days supply | Qty: 90 | Fill #0

## 2019-08-02 ENCOUNTER — Ambulatory Visit: Payer: 59 | Admitting: Family Medicine

## 2019-08-09 ENCOUNTER — Ambulatory Visit (INDEPENDENT_AMBULATORY_CARE_PROVIDER_SITE_OTHER): Payer: 59 | Admitting: Psychiatry

## 2019-08-09 ENCOUNTER — Encounter (HOSPITAL_COMMUNITY): Payer: Self-pay | Admitting: Psychiatry

## 2019-08-09 ENCOUNTER — Other Ambulatory Visit: Payer: Self-pay

## 2019-08-09 DIAGNOSIS — F4010 Social phobia, unspecified: Secondary | ICD-10-CM

## 2019-08-09 DIAGNOSIS — F330 Major depressive disorder, recurrent, mild: Secondary | ICD-10-CM | POA: Diagnosis not present

## 2019-08-09 DIAGNOSIS — F410 Panic disorder [episodic paroxysmal anxiety] without agoraphobia: Secondary | ICD-10-CM | POA: Diagnosis not present

## 2019-08-09 NOTE — Progress Notes (Signed)
BH MD/PA/NP OP Progress Note  08/09/2019 11:28 AM Julie Warner  MRN:  [id number] Interview was conducted using WebEx teleconferencing application and I verified that I was speaking with the correct person using two identifiers. I discussed the limitations of evaluation and management by telemedicine and  the availability of in person appointments. Patient expressed understanding and agreed to proceed.  Chief Complaint: Some depression, episodic anxiety.  HPI: Julie Warner is a 24 y.o MWF who comes for her first post-hospitalization follow up visit. She has a hx of depression and anxiety (scial anxiety combined with episodic panic attacks) which worsened earlier this year to a point that she started to have passive SI. She presented voluntarily to [facility] and ws admitted for overnight observation (see dc summary from 07/20/19).  She endorsed having 1-2 month hx of worsening depressive symptoms including anxiety, isolation, irritability, restfulness, guilt, worthlessness and hopelessness. She also reported having auditory and visual hallucinations: she sees movement and random objects; she hears voices talking but unable to understand what is being said. She has been on sertraline 50 mg for close to two years prescribed by her PCP. Prior to that she took escitalopram for about a month but she was suspected of having a TIA and there was concern that it might have been triggered by Lexapro. She has a history of self harm by cutting as a teenager: at 19 or 17 she cut her arms.  She was never before psychiatrically hospitalized or treated by a psychiatrist. She has been prescribed alprazolam 0.25 mg prn anxiety by her PCP and she reports it works well. Her sleep is interrupted but overall she sleeps enough and does not complain of daytime fatigue.  She denies any drug or alcohol use. While in the [facility] dose of sertraline was increased to 100 mg and low 2 mg dose of aripiprazole was added. Her mood has improved, SI  resolved, quasi hallucinations (she is fully aware that these are not real) subsided although not fully resolved.   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 She reports that her parents, sister and husbands are supportive.  Yer denies having hx of abuse/trauma.    Visit Diagnosis:    ICD-10-CM   1. Panic disorder  F41.0   2. Social anxiety disorder  F40.10   3. Major depressive disorder, recurrent episode, mild (HCC)  F33.0     Past Psychiatric History: Please see above.  Past Medical History:  Past Medical History:  Diagnosis Date  . Allergy    seasonal   . Anxiety   . Depression     Past Surgical History:  Procedure Laterality Date  . ANTERIOR CRUCIATE LIGAMENT REPAIR Left 11/30/2018   Procedure: LEFT KNEE ANTERIOR CRUCIATE LIGAMENT (ACL) RECONSTRUCTION;  Surgeon: Meredith Pel, MD;  Location: [facility];  Service: Orthopedics;  Laterality: Left;  . NO PAST SURGERIES      Family Psychiatric History: Mother hx of alcohol problem drinking (in remission) and depression.  Family History:  Family History  Problem Relation Age of Onset  . Alcohol abuse Mother   . Depression Mother     Social History:  Social History   Socioeconomic History  . Marital status: Married    Spouse name: Not on file  . Number of children: 0  . Years of education: Not on file  . Highest education level: Not on file  Occupational History  . Not on file  Tobacco Use  . Smoking  status: Never Smoker  . Smokeless tobacco: Never Used  Substance and Sexual Activity  . Alcohol use: No  . Drug use: No  . Sexual activity: Not on file  Other Topics Concern  . Not on file  Social History Narrative  . Not on file   Social Determinants of Health   Financial Resource Strain:   . Difficulty of Paying Living Expenses:   Food Insecurity:   . Worried About Charity fundraiser in the Last Year:   . Arboriculturist in the Last Year:   Transportation Needs:   . Lexicographer (Medical):   [name] Lack of Transportation (Non-Medical):   Physical Activity:   . Days of Exercise per Week:   . Minutes of Exercise per Session:   Stress:   . Feeling of Stress :   Social Connections:   . Frequency of Communication with Friends and Family:   . Frequency of Social Gatherings with Friends and Family:   . Attends Religious Services:   . Active Member of Clubs or Organizations:   . Attends Archivist Meetings:   [name] Marital Status:     Allergies:  Allergies  Allergen Reactions  . Lexapro [Escitalopram Oxalate] Other (See Comments)    stroke    Metabolic Disorder Labs: No results found for: HGBA1C, MPG No results found for: PROLACTIN No results found for: CHOL, TRIG, HDL, CHOLHDL, VLDL, LDLCALC No results found for: TSH  Therapeutic Level Labs: No results found for: LITHIUM No results found for: VALPROATE No components found for:  CBMZ  Current Medications: Current Outpatient Medications  Medication Sig Dispense Refill  . ALPRAZolam (XANAX) 0.25 MG tablet Take 0.25 mg by mouth 2 (two) times daily as needed for anxiety.   0  . ARIPiprazole (ABILIFY) 2 MG tablet Take 1 tablet (2 mg total) by mouth daily. 90 tablet 1  . norethindrone-ethinyl estradiol-iron (MICROGESTIN FE,GILDESS FE,LOESTRIN FE) 1.5-30 MG-MCG tablet Take 1 tablet by mouth daily.    Riley Nearing Vit-Fe Gly Cys-FA-Omega (ENBRACE HR) CAPS Take 1 capsule by mouth daily. May get from [email or web address] if not covered 30 capsule 11  . sertraline (ZOLOFT) 100 MG tablet Take 1 tablet (100 mg total) by mouth daily. 90 tablet 1   No current facility-administered medications for this visit.    Psychiatric Specialty Exam: Review of Systems  Psychiatric/Behavioral: The patient is nervous/anxious.   All other systems reviewed and are negative.   There were no vitals taken for this visit.There is no height or weight on file to calculate BMI.  General Appearance: Casual and Well Groomed  Eye  Contact:  Good  Speech:  Clear and Coherent and Normal Rate  Volume:  Normal  Mood:  Some residual depression and anxiety.  Affect:  Full Range  Thought Process:  Goal Directed and Linear  Orientation:  Full (Time, Place, and Person)  Thought Content: Logical and vague mostly visual hallucinations/illusions   Suicidal Thoughts:  No  Homicidal Thoughts:  No  Memory:  Immediate;   Good Recent;   Good Remote;   Good  Judgement:  Good  Insight:  Fair  Psychomotor Activity:  Normal  Concentration:  Concentration: Good  Recall:  Good  Fund of Knowledge: Good  Language: Good  Akathisia:  Negative  Handed:  Right  AIMS (if indicated): not done  Assets:  Communication Skills Desire for Improvement Financial Resources/Insurance Housing Social Support Talents/Skills  ADL's:  Intact  Cognition: WNL  Sleep:  Fair  Screenings: AIMS     Admission (Discharged) from OP Visit from 07/19/2019 in [place] Total Score  0    AUDIT     Admission (Discharged) from OP Visit from 07/19/2019 in [place]  Alcohol Use Disorder Identification Test Final Score (AUDIT)  0       Assessment and Plan: 24 y.o MWF who comes for her first post-hospitalization follow up visit. She has a hx of depression and anxiety (scial anxiety combined with episodic panic attacks) which worsened earlier this year to a point that she started to have passive SI. She presented voluntarily to [facility] and ws admitted for overnight observation (see dc summary from 07/20/19).  She endorsed having 1-2 month hx of worsening depressive symptoms including anxiety, isolation, irritability, restfulness, guilt, worthlessness and hopelessness. She also reported having auditory and visual hallucinations: she sees movement and random objects; she hears voices talking but unable to understand what is being said. She has been on sertraline 50 mg for close to two years prescribed by her PCP.  Prior to that she took escitalopram for about a month but she was suspected of having a TIA and there was concern that it might have been triggered by Lexapro. She has a history of self harm by cutting as a teenager: at 47 or 17 she cut her arms.  She was never before psychiatrically hospitalized or treated by a psychiatrist. She has been prescribed alprazolam 0.25 mg prn anxiety by her PCP and she reports it works well. Her sleep is interrupted but overall she sleeps enough and does not complain of daytime fatigue.  She denies any drug or alcohol use. While in the [facility] dose of sertraline was increased to 100 mg and low 2 mg dose of aripiprazole was added. Her mood has improved, SI resolved, quasi hallucinations (she is fully aware that these are not real) subsided although not fully resolved.   Dx: MDD recurrent, mild; Panic disorder, Social anxiety disorder  Plan; Given good response to medication changes made 3 weeks ago we will continue sertraline 100 mg, aripiprazole 2 mg and alprazolam 0.25 mg bid prn anxiety unchanged. She would benefit and is open to start individual counseling - we will make a referral. She ws offered an option of adding a sleep aid but does not feel she needs one at this time. She does not need any medication refills at this time - in the future she would prefer to have Rx send to [place]. Next appointment in 2 months. The plan was discussed with patient who had an opportunity to ask questions and these were all answered. I spend 45 minutes in videoconferencing with the patient and devoted approximately 50% of this time to explanation of diagnosis, discussion of treatment options and med education.  Stephanie Acre, MD 08/09/2019, 11:28 AM

## 2019-10-17 MED FILL — LARIN FE 1.5-30 TABLET: 1.5-30 | 28 days supply | Qty: 28 | Fill #0

## 2019-10-19 ENCOUNTER — Other Ambulatory Visit: Payer: Self-pay

## 2019-10-19 ENCOUNTER — Telehealth (INDEPENDENT_AMBULATORY_CARE_PROVIDER_SITE_OTHER): Payer: 59 | Admitting: Psychiatry

## 2019-10-19 DIAGNOSIS — F3342 Major depressive disorder, recurrent, in full remission: Secondary | ICD-10-CM

## 2019-10-19 DIAGNOSIS — F410 Panic disorder [episodic paroxysmal anxiety] without agoraphobia: Secondary | ICD-10-CM

## 2019-10-19 DIAGNOSIS — F4010 Social phobia, unspecified: Secondary | ICD-10-CM | POA: Diagnosis not present

## 2019-10-19 MED ORDER — ALPRAZOLAM 0.25 MG PO TABS: 0.2500 mg | ORAL_TABLET | Freq: Every day | ORAL | 2 refills | Status: DC | PRN

## 2019-10-19 MED FILL — ALPRAZolam 0.25 MG TABS: 0.25 | 30 days supply | Qty: 30 | Fill #0

## 2019-10-19 NOTE — Progress Notes (Signed)
BH MD/PA/NP OP Progress Note  10/19/2019 10:11 AM Julie Warner  MRN:  [id number] Interview was conducted using videoconferencing application and I verified that I was speaking with the correct person using two identifiers. I discussed the limitations of evaluation and management by telemedicine and  the availability of in person appointments. Patient expressed understanding and agreed to proceed. Patient location - home; physician - home office.  Chief Complaint: "I am fine".  HPI: 24 y.o MWF with hx of depression and anxiety (social anxiety combined with episodic panic attacks) which worsened earlier this year to a point that she started to have passive SI. She presented voluntarily to [facility] and ws admitted for overnight observation (see dc summary from 07/20/19).  She endorsed having 1-2 month hx of worsening depressive symptoms including anxiety, isolation, irritability, restfulness, guilt, worthlessness and hopelessness. She has been on sertraline 50 mg for close to two years prescribed by her PCP. Prior to that she took escitalopram for about a month but she was suspected of having a TIA and there was concern that it might have been triggered by Lexapro. She has a history of self harm by cutting as a teenager: at 52 or 17 she cut her arms. She has been prescribed alprazolam 0.25 mg prn anxiety by her PCP and she reports it works well. At this time she does not need to use it more often than twice a week. Her sleep improved - she sleeps 7 hours uninterrupted but after she takes her morning meds (sertraline and aripiprazole) she takes another 2 hr nap. While in the [facility] dose of sertraline was increased to 100 mg and low 2 mg dose of aripiprazole was added. Her mood has improved, SI resolved. She and her husband will be moving to a new hose in [place] soon.   Visit Diagnosis:    ICD-10-CM   1. Social anxiety disorder  F40.10   2. Panic disorder  F41.0   3. Major depressive disorder, recurrent  episode, in full remission ([place])  F33.42     Past Psychiatric History: Please see intake H&P.  Past Medical History:  Past Medical History:  Diagnosis Date  . Allergy    seasonal   . Anxiety   . Depression     Past Surgical History:  Procedure Laterality Date  . ANTERIOR CRUCIATE LIGAMENT REPAIR Left 11/30/2018   Procedure: LEFT KNEE ANTERIOR CRUCIATE LIGAMENT (ACL) RECONSTRUCTION;  Surgeon: Meredith Pel, MD;  Location: [facility];  Service: Orthopedics;  Laterality: Left;  . NO PAST SURGERIES      Family Psychiatric History: Reviewed.  Family History:  Family History  Problem Relation Age of Onset  . Alcohol abuse Mother   . Depression Mother     Social History:  Social History   Socioeconomic History  . Marital status: Married    Spouse name: Not on file  . Number of children: 0  . Years of education: Not on file  . Highest education level: Not on file  Occupational History  . Not on file  Tobacco Use  . Smoking status: Never Smoker  . Smokeless tobacco: Never Used  Vaping Use  . Vaping Use: Never used  Substance and Sexual Activity  . Alcohol use: No  . Drug use: No  . Sexual activity: Not on file  Other Topics Concern  . Not on file  Social History Narrative  . Not on file   Social Determinants of Health   Financial Resource Strain:   .  Difficulty of Paying Living Expenses:   Food Insecurity:   . Worried About Charity fundraiser in the Last Year:   . Arboriculturist in the Last Year:   Transportation Needs:   . Film/video editor (Medical):   [name] Lack of Transportation (Non-Medical):   Physical Activity:   . Days of Exercise per Week:   . Minutes of Exercise per Session:   Stress:   . Feeling of Stress :   Social Connections:   . Frequency of Communication with Friends and Family:   . Frequency of Social Gatherings with Friends and Family:   . Attends Religious Services:   . Active Member of Clubs or Organizations:   . Attends Theatre manager Meetings:   [name] Marital Status:     Allergies:  Allergies  Allergen Reactions  . Lexapro [Escitalopram Oxalate] Other (See Comments)    stroke    Metabolic Disorder Labs: No results found for: HGBA1C, MPG No results found for: PROLACTIN No results found for: CHOL, TRIG, HDL, CHOLHDL, VLDL, LDLCALC No results found for: TSH  Therapeutic Level Labs: No results found for: LITHIUM No results found for: VALPROATE No components found for:  CBMZ  Current Medications: Current Outpatient Medications  Medication Sig Dispense Refill  . ALPRAZolam (XANAX) 0.25 MG tablet Take 1 tablet (0.25 mg total) by mouth daily as needed for anxiety. 30 tablet 2  . ARIPiprazole (ABILIFY) 2 MG tablet Take 1 tablet (2 mg total) by mouth daily. 90 tablet 1  . norethindrone-ethinyl estradiol-iron (MICROGESTIN FE,GILDESS FE,LOESTRIN FE) 1.5-30 MG-MCG tablet Take 1 tablet by mouth daily.    Riley Nearing Vit-Fe Gly Cys-FA-Omega (ENBRACE HR) CAPS Take 1 capsule by mouth daily. May get from [email or web address] if not covered 30 capsule 11  . sertraline (ZOLOFT) 100 MG tablet Take 1 tablet (100 mg total) by mouth daily. 90 tablet 1   No current facility-administered medications for this visit.     Psychiatric Specialty Exam: Review of Systems  Psychiatric/Behavioral: Positive for sleep disturbance. The patient is nervous/anxious.   All other systems reviewed and are negative.   There were no vitals taken for this visit.There is no height or weight on file to calculate BMI.  General Appearance: Casual and Well Groomed  Eye Contact:  Good  Speech:  Clear and Coherent and Normal Rate  Volume:  Normal  Mood:  Occasional anxiety.  Affect:  Full Range  Thought Process:  Goal Directed  Orientation:  Full (Time, Place, and Person)  Thought Content: Logical   Suicidal Thoughts:  No  Homicidal Thoughts:  No  Memory:  Immediate;   Good Recent;   Good Remote;   Good  Judgement:  Good  Insight:  Good   Psychomotor Activity:  Normal  Concentration:  Concentration: Good  Recall:  Good  Fund of Knowledge: Good  Language: Good  Akathisia:  Negative  Handed:  Right  AIMS (if indicated): not done  Assets:  Communication Skills Desire for Improvement Financial Resources/Insurance Housing Physical Health Social Support  ADL's:  Intact  Cognition: WNL  Sleep:  Good   Screenings: AIMS     Admission (Discharged) from OP Visit from 07/19/2019 in [facility] Total Score 0    AUDIT     Admission (Discharged) from OP Visit from 07/19/2019 in [place]  Alcohol Use Disorder Identification Test Final Score (AUDIT) 0       Assessment and Plan: 24 y.o MWF  with hx of depression and anxiety (social anxiety combined with episodic panic attacks) which worsened earlier this year to a point that she started to have passive SI. She presented voluntarily to [facility] and ws admitted for overnight observation (see dc summary from 07/20/19).  She endorsed having 1-2 month hx of worsening depressive symptoms including anxiety, isolation, irritability, restfulness, guilt, worthlessness and hopelessness. She has been on sertraline 50 mg for close to two years prescribed by her PCP. Prior to that she took escitalopram for about a month but she was suspected of having a TIA and there was concern that it might have been triggered by Lexapro. She has a history of self harm by cutting as a teenager: at 30 or 17 she cut her arms. She has been prescribed alprazolam 0.25 mg prn anxiety by her PCP and she reports it works well. At this time she does not need to use it more often than twice a week. Her sleep improved - she sleeps 7 hours uninterrupted but after she takes her morning meds (sertraline and aripiprazole) she takes another 2 hr nap. While in the [facility] dose of sertraline was increased to 100 mg and low 2 mg dose of aripiprazole was added. Her mood has improved, SI  resolved. She and her husband will be moving to a new hose in [place] soon.   Dx: MDD recurrent, in remission; Panic disorder, Social anxiety disorder  Plan: Continue sertraline 100 mg, aripiprazole 2 mg and alprazolam 0.25 mg prn anxiety. I suggested moving the first two to HS as it appears she feels tired/sleepy after taking them in AM.  Next appointment in 3 months. The plan was discussed with patient who had an opportunity to ask questions and these were all answered. I spend 15 minutes in videoconferencing with the patient.     Stephanie Acre, MD 10/19/2019, 10:11 AM

## 2019-11-08 MED FILL — LARIN FE 1.5-30 TABLET: 1.5-30 | 28 days supply | Qty: 28 | Fill #0

## 2019-11-26 IMAGING — MR MR KNEE*L* W/O CM
4 of 7 series · 22 of 40 positions shown · non-contrast
Comparison: None.

CLINICAL DATA: Patient injured knee playing soccer 6 years ago and
presents with pain, stiffness and swelling. No prior surgery.
Prolonged walking, jumping or standing makes the pain worse.

EXAM:
MRI OF THE LEFT KNEE WITHOUT CONTRAST
TECHNIQUE: Multiplanar, multisequence MR imaging of the knee was performed. No
intravenous contrast was administered.

[Series 4: T2 fat-sat · axial · 4.0mm · 0.50mm/px · z∈[-68,+56]mm · 5 of 26 slices shown]
[im 1/26]
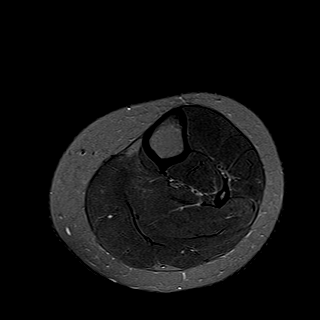
[im 6/26]
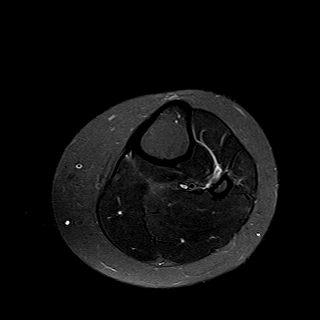
[im 11/26]
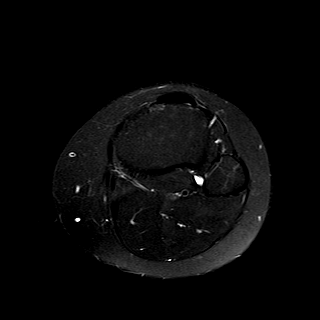
[im 16/26]
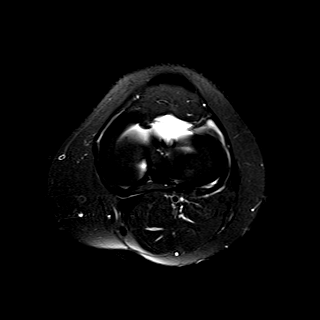
[im 26/26]
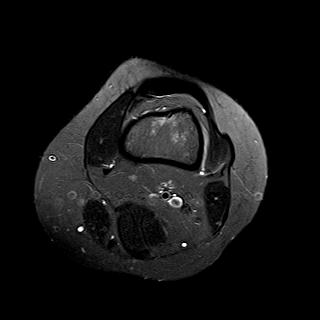

[Series 8: PD fat-sat · sagittal · 3.0mm · 0.29mm/px · 7 of 27 slices shown (1 of 3)]
[im 1/27]
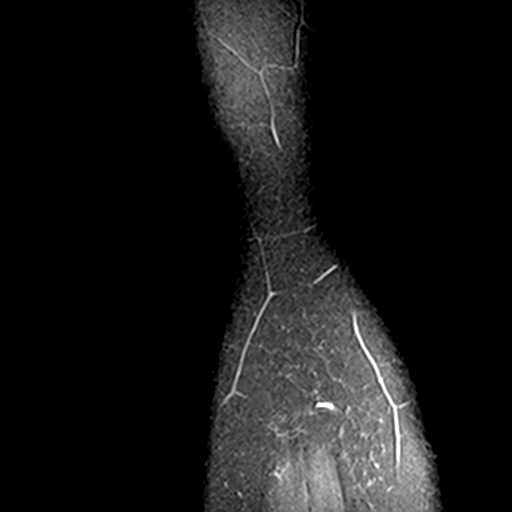
[im 5/27]
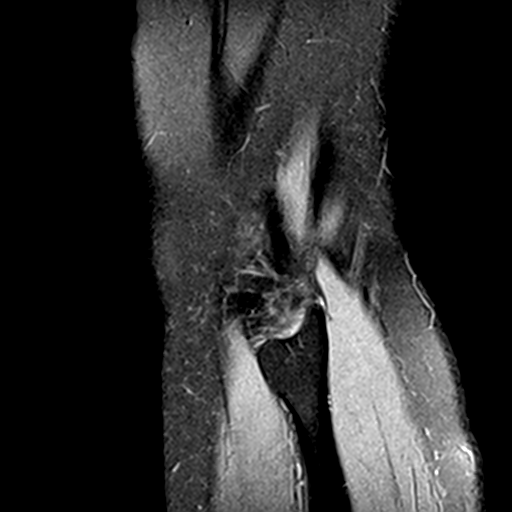
[im 9/27]
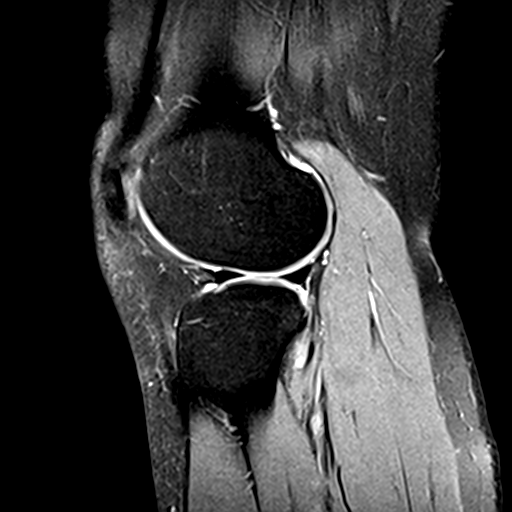
[im 14/27]
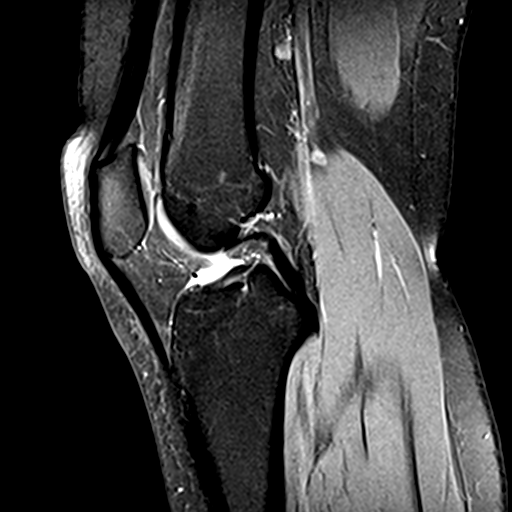
[im 18/27]
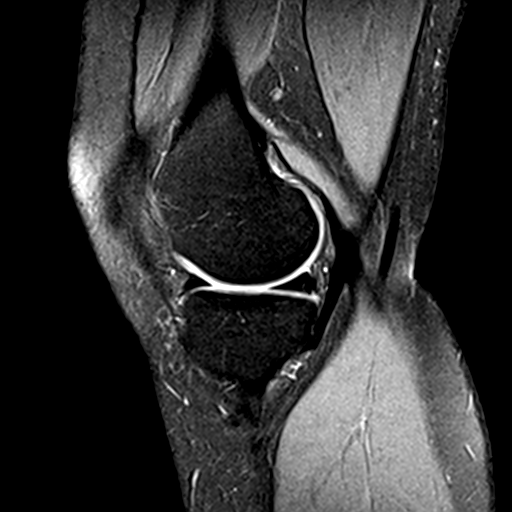
[im 22/27]
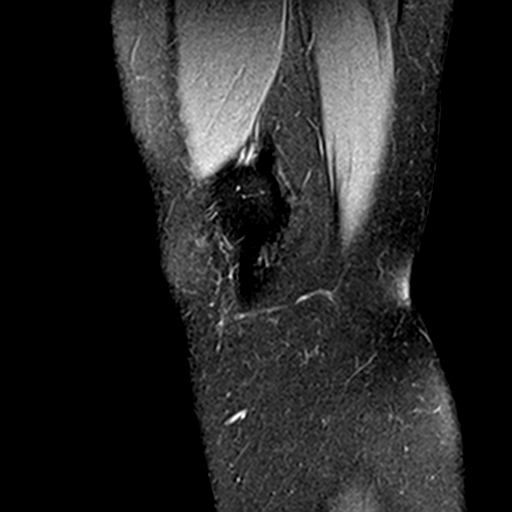
[im 27/27]
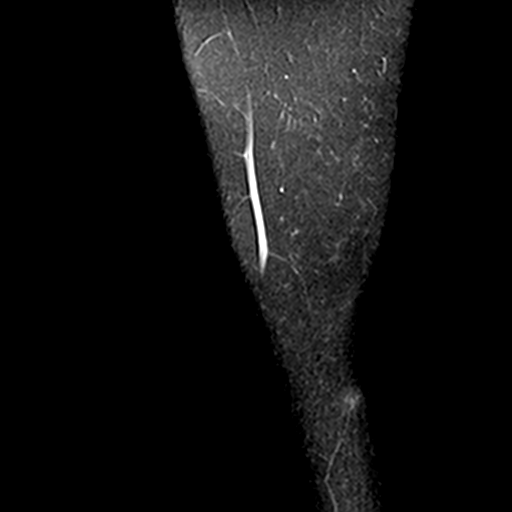

[Series 9: PD fat-sat · coronal · 3.0mm · 0.29mm/px · 7 of 28 slices shown (2 of 3)]
[im 1/28]
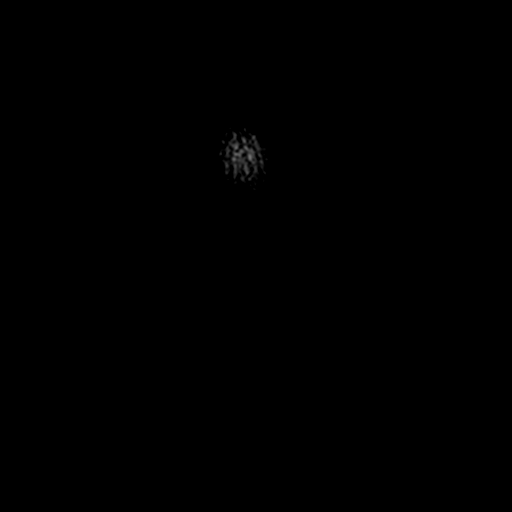
[im 5/28]
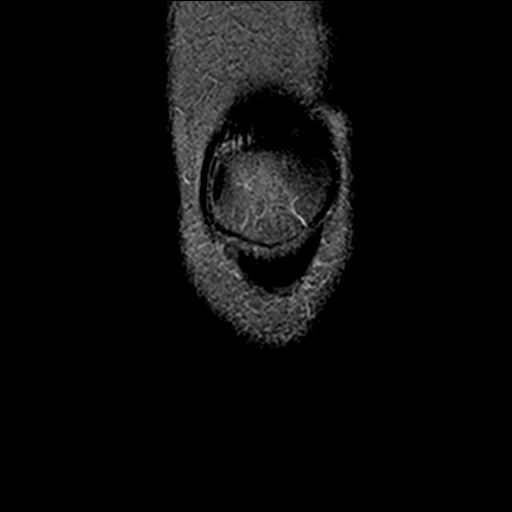
[im 10/28]
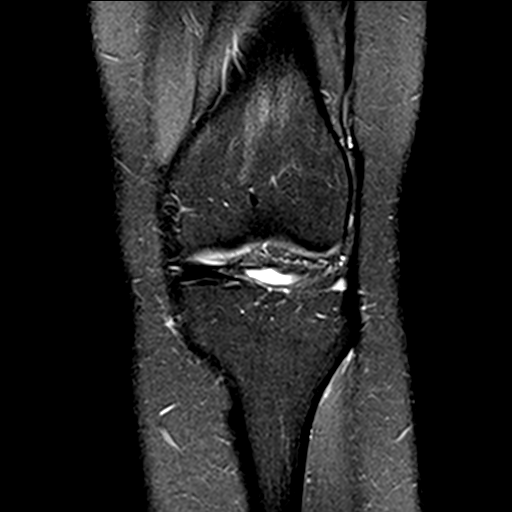
[im 14/28]
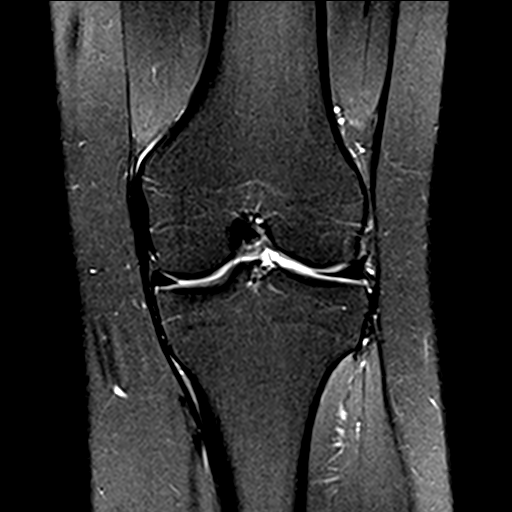
[im 19/28]
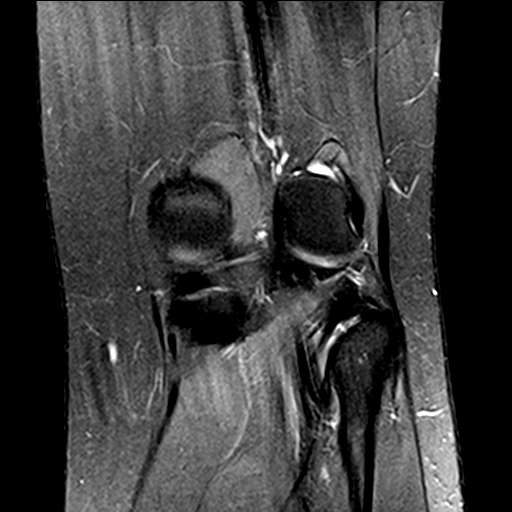
[im 23/28]
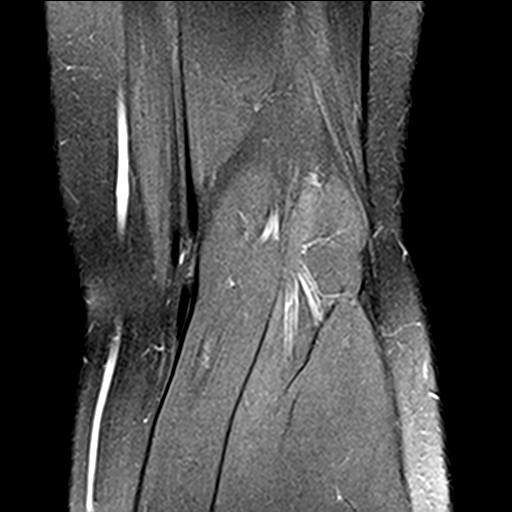
[im 28/28]
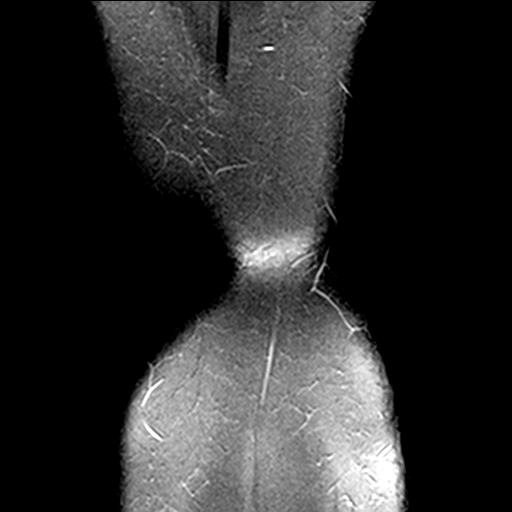

[Series 10: PD fat-sat · oblique · 2.0mm · 0.29mm/px · 3 of 11 slices shown (3 of 3)]
[im 1/11]
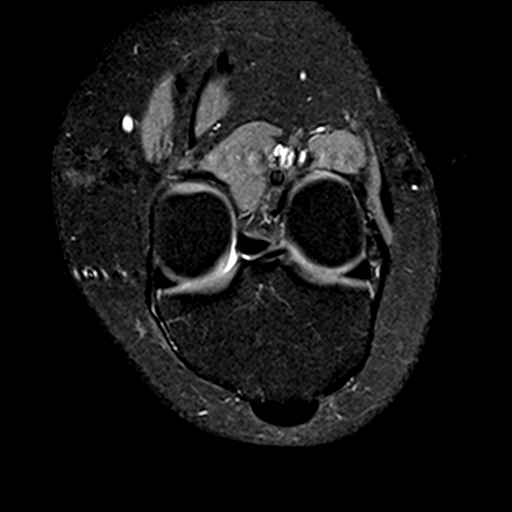
[im 6/11]
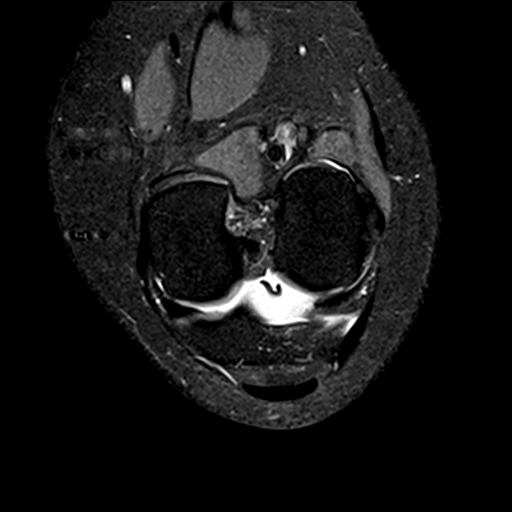
[im 11/11]
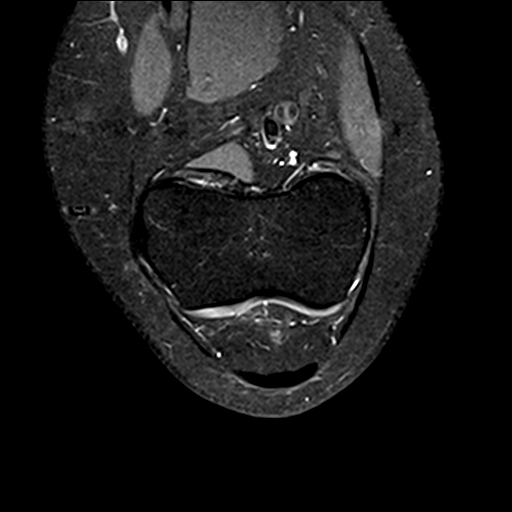

[22 of 40 positions shown; findings below may reference images not displayed]

FINDINGS: MENISCI

Medial meniscus:  Intact

Lateral meniscus:  Intact

LIGAMENTS

Cruciates: Partial tear of the anterior cruciate ligament involving
appear to represent the posterolateral fibers, series [DATE].
Posterior cruciate ligament appears intact

Collaterals:  Intact

CARTILAGE

Patellofemoral:  No focal chondral defect.

Medial:  Intact

Lateral:  Intact

Joint: No joint effusion. Normal Hoffa's fat pad. No plical
thickening.

Popliteal Fossa:  No cyst the popliteal fossa.  Intact popliteus.

Extensor Mechanism:  Intact extensor mechanism and MPFL.

Bones:  No acute fracture.  No suspicious osseous lesions.

Other: None
IMPRESSION: 1. Partial tear of the anterior cruciate ligament.
2. No focal chondral defect.
3. Intact menisci, PCL and collateral ligaments.
4. No acute marrow signal abnormality to suggest fracture or
suspicious osseous lesion.

## 2019-12-02 MED FILL — SERTRALINE HCL 100 MG TAB: 100 | 90 days supply | Qty: 90 | Fill #1

## 2019-12-12 MED FILL — ALPRAZolam 0.25 MG TABS: 0.25 | 30 days supply | Qty: 30 | Fill #1

## 2019-12-12 MED FILL — LARIN FE 1.5-30 TABLET: 1.5-30 | 28 days supply | Qty: 28 | Fill #1

## 2019-12-26 DIAGNOSIS — R200 Anesthesia of skin: Secondary | ICD-10-CM | POA: Diagnosis not present

## 2019-12-27 ENCOUNTER — Other Ambulatory Visit: Payer: Self-pay | Admitting: Family Medicine

## 2019-12-27 DIAGNOSIS — R200 Anesthesia of skin: Secondary | ICD-10-CM

## 2019-12-30 ENCOUNTER — Ambulatory Visit
Admission: RE | Admit: 2019-12-30 | Discharge: 2019-12-30 | Disposition: A | Discharge status: Home / Self Care | Payer: 59 | Source: Ambulatory Visit | Attending: Family Medicine | Admitting: Family Medicine

## 2019-12-30 DIAGNOSIS — R200 Anesthesia of skin: Secondary | ICD-10-CM | POA: Diagnosis not present

## 2019-12-30 DIAGNOSIS — R202 Paresthesia of skin: Secondary | ICD-10-CM | POA: Diagnosis not present

## 2020-01-10 MED FILL — LARIN FE 1.5-30 TABLET: 1.5-30 | 84 days supply | Qty: 84 | Fill #2

## 2020-01-12 ENCOUNTER — Telehealth (INDEPENDENT_AMBULATORY_CARE_PROVIDER_SITE_OTHER): Payer: 59 | Admitting: Psychiatry

## 2020-01-12 ENCOUNTER — Other Ambulatory Visit: Payer: Self-pay

## 2020-01-12 DIAGNOSIS — F3342 Major depressive disorder, recurrent, in full remission: Secondary | ICD-10-CM

## 2020-01-12 DIAGNOSIS — F410 Panic disorder [episodic paroxysmal anxiety] without agoraphobia: Secondary | ICD-10-CM

## 2020-01-12 DIAGNOSIS — F4010 Social phobia, unspecified: Secondary | ICD-10-CM | POA: Diagnosis not present

## 2020-01-12 MED ORDER — ALPRAZOLAM 0.25 MG PO TABS: 0.2500 mg | ORAL_TABLET | Freq: Every day | ORAL | 3 refills | Status: AC | PRN

## 2020-01-12 MED ORDER — ARIPIPRAZOLE 2 MG PO TABS: 2.0000 mg | ORAL_TABLET | Freq: Every day | ORAL | 1 refills | Status: DC

## 2020-01-12 MED ORDER — SERTRALINE HCL 100 MG PO TABS: 100.0000 mg | ORAL_TABLET | Freq: Every day | ORAL | 1 refills | Status: DC

## 2020-01-12 MED FILL — ALPRAZolam 0.25 MG TABS: 0.25 | 30 days supply | Qty: 30 | Fill #0

## 2020-01-12 MED FILL — ARIPiprazole 2 MG TABS: 2 | 90 days supply | Qty: 90 | Fill #0

## 2020-01-12 NOTE — Progress Notes (Signed)
BH MD/PA/NP OP Progress Note  01/12/2020 10:12 AM SIEARRA AMBERG  MRN:  [id number] Interview was conducted using videoconferencing application and I verified that I was speaking with the correct person using two identifiers. I discussed the limitations of evaluation and management by telemedicine and  the availability of in person appointments. Patient expressed understanding and agreed to proceed. Patient location - home; physician - home office.  Chief Complaint: Some fatigue.  HPI: 24 y.o.MWFwith hx of depression and anxiety (social anxiety combined with episodic panic attacks) which worsened earlier this year to a point that she started to have passive SI. [facility] for overnight observation (see dc summary from 07/20/19).She endorsed having 1-2 month hx of worseningdepressive symptomsincludinganxiety, isolation, irritability, restfulness, guilt, worthlessness and hopelessness.She has been on sertraline 50 mg for close to two years prescribed by her PCP. Prior to that she took escitalopram for about a month but she was suspected of having a TIA and there was concern that it might have been triggered by Lexapro. She has a history of self harm by cutting as a teenager:at 16 or 17shecut her arms. While in the [facility] dose of sertraline was increased to 100 mg and low 2 mg dose of aripiprazole was added. She has been prescribed alprazolam 0.25 mg prn anxiety by her PCP and she reports it works well. At this time she does not need to use it more often than twice a week. Her sleep improved - she sleeps 7 hours uninterrupted. She still c/o midday fatigue despite moving sertraline and aripiprazole to late PM. I doubt this is related to any of her meds.  Her mood has improved, SI resolved. She and her husband have moved to a new house in [facility] [place] [facility] - she finds this exciting.   Visit Diagnosis:    ICD-10-CM   1. Major depressive disorder, recurrent episode, in full  remission ([place])  F33.42   2. Panic disorder  F41.0   3. Social anxiety disorder  F40.10     Past Psychiatric History: Please see intake H&P.  Past Medical History:  Past Medical History:  Diagnosis Date  . Allergy    seasonal   . Anxiety   . Depression     Past Surgical History:  Procedure Laterality Date  . ANTERIOR CRUCIATE LIGAMENT REPAIR Left 11/30/2018   Procedure: LEFT KNEE ANTERIOR CRUCIATE LIGAMENT (ACL) RECONSTRUCTION;  Surgeon: Meredith Pel, MD;  Location: [place];  Service: Orthopedics;  Laterality: Left;  . NO PAST SURGERIES      Family Psychiatric History: Reviewed.  Family History:  Family History  Problem Relation Age of Onset  . Alcohol abuse Mother   . Depression Mother     Social History:  Social History   Socioeconomic History  . Marital status: Married    Spouse name: Not on file  . Number of children: 0  . Years of education: Not on file  . Highest education level: Not on file  Occupational History  . Not on file  Tobacco Use  . Smoking status: Never Smoker  . Smokeless tobacco: Never Used  Vaping Use  . Vaping Use: Never used  Substance and Sexual Activity  . Alcohol use: No  . Drug use: No  . Sexual activity: Not on file  Other Topics Concern  . Not on file  Social History Narrative  . Not on file   Social Determinants of Health   Financial Resource Strain:   . Difficulty of  Paying Living Expenses: Not on file  Food Insecurity:   . Worried About Charity fundraiser in the Last Year: Not on file  . Ran Out of Food in the Last Year: Not on file  Transportation Needs:   . Lack of Transportation (Medical): Not on file  . Lack of Transportation (Non-Medical): Not on file  Physical Activity:   . Days of Exercise per Week: Not on file  . Minutes of Exercise per Session: Not on file  Stress:   . Feeling of Stress : Not on file  Social Connections:   . Frequency of Communication with Friends and Family: Not on file  .  Frequency of Social Gatherings with Friends and Family: Not on file  . Attends Religious Services: Not on file  . Active Member of Clubs or Organizations: Not on file  . Attends Archivist Meetings: Not on file  . Marital Status: Not on file    Allergies:  Allergies  Allergen Reactions  . Lexapro [Escitalopram Oxalate] Other (See Comments)    stroke    Metabolic Disorder Labs: No results found for: HGBA1C, MPG No results found for: PROLACTIN No results found for: CHOL, TRIG, HDL, CHOLHDL, VLDL, LDLCALC No results found for: TSH  Therapeutic Level Labs: No results found for: LITHIUM No results found for: VALPROATE No components found for:  CBMZ  Current Medications: Current Outpatient Medications  Medication Sig Dispense Refill  . ALPRAZolam (XANAX) 0.25 MG tablet Take 1 tablet (0.25 mg total) by mouth daily as needed for anxiety. 30 tablet 3  . [START ON 01/16/2020] ARIPiprazole (ABILIFY) 2 MG tablet Take 1 tablet (2 mg total) by mouth daily at 6 PM. 90 tablet 1  . norethindrone-ethinyl estradiol-iron (MICROGESTIN FE,GILDESS FE,LOESTRIN FE) 1.5-30 MG-MCG tablet Take 1 tablet by mouth daily.    Riley Nearing Vit-Fe Gly Cys-FA-Omega (ENBRACE HR) CAPS Take 1 capsule by mouth daily. May get from [email or web address] if not covered 30 capsule 11  . [START ON 01/16/2020] sertraline (ZOLOFT) 100 MG tablet Take 1 tablet (100 mg total) by mouth daily at 6 PM. 90 tablet 1   No current facility-administered medications for this visit.      Psychiatric Specialty Exam: Review of Systems  Constitutional: Positive for fatigue.  All other systems reviewed and are negative.   There were no vitals taken for this visit.There is no height or weight on file to calculate BMI.  General Appearance: Casual and Well Groomed  Eye Contact:  Good  Speech:  Clear and Coherent and Normal Rate  Volume:  Normal  Mood:  Euthymic  Affect:  Full Range  Thought Process:  Goal Directed  Orientation:  Full  (Time, Place, and Person)  Thought Content: Logical   Suicidal Thoughts:  No  Homicidal Thoughts:  No  Memory:  Immediate;   Good Recent;   Good Remote;   Good  Judgement:  Good  Insight:  Good  Psychomotor Activity:  Normal  Concentration:  Concentration: Good  Recall:  Good  Fund of Knowledge: Good  Language: Good  Akathisia:  Negative  Handed:  Right  AIMS (if indicated): not done  Assets:  Communication Skills Desire for Improvement Financial Resources/Insurance Housing Intimacy Social Support Talents/Skills  ADL's:  Intact  Cognition: WNL  Sleep:  Good   Screenings: AIMS     Admission (Discharged) from OP Visit from 07/19/2019 in [place] Total Score 0    AUDIT     Admission (Discharged)  from OP Visit from 07/19/2019 in [place]  Alcohol Use Disorder Identification Test Final Score (AUDIT) 0       Assessment and Plan: 24 y.o.MWFwith hx of depression and anxiety (social anxiety combined with episodic panic attacks) which worsened earlier this year to a point that she started to have passive SI. [facility] and [facility] for overnight observation (see dc summary from 07/20/19).She endorsed having 1-2 month hx of worseningdepressive symptomsincludinganxiety, isolation, irritability, restfulness, guilt, worthlessness and hopelessness.She has been on sertraline 50 mg for close to two years prescribed by her PCP. Prior to that she took escitalopram for about a month but she was suspected of having a TIA and there was concern that it might have been triggered by Lexapro. She has a history of self harm by cutting as a teenager:at 16 or 17shecut her arms. While in the [facility] dose of sertraline was increased to 100 mg and low 2 mg dose of aripiprazole was added. She has been prescribed alprazolam 0.25 mg prn anxiety by her PCP and she reports it works well. At this time she does not need to use  it more often than twice a week. Her sleep improved - she sleeps 7 hours uninterrupted. She still c/o midday fatigue despite moving sertraline and aripiprazole to late PM. I doubt this is related to any of her meds.  Her mood has improved, SI resolved. She and her husband have moved to a new house in [place] - she finds this exciting.   Dx: MDD recurrent, in remission; Panic disorder, Social anxiety disorder  Plan: Continue sertraline 100 mg, aripiprazole 2 mg and alprazolam 0.25 mg prn anxiety. Next appointment in 3 months.The plan was discussed with patient who had an opportunity to ask questions and these were all answered. I spend 15 minutes invideoconferencingwith the    Stephanie Acre, MD 01/12/2020, 10:12 AM

## 2020-01-18 ENCOUNTER — Other Ambulatory Visit (HOSPITAL_COMMUNITY): Payer: Self-pay | Admitting: Family Medicine

## 2020-01-18 DIAGNOSIS — R443 Hallucinations, unspecified: Secondary | ICD-10-CM | POA: Diagnosis not present

## 2020-01-18 DIAGNOSIS — F321 Major depressive disorder, single episode, moderate: Secondary | ICD-10-CM | POA: Diagnosis not present

## 2020-01-18 DIAGNOSIS — F419 Anxiety disorder, unspecified: Secondary | ICD-10-CM | POA: Diagnosis not present

## 2020-01-18 DIAGNOSIS — F952 Tourette's disorder: Secondary | ICD-10-CM | POA: Diagnosis not present

## 2020-01-18 DIAGNOSIS — R5383 Other fatigue: Secondary | ICD-10-CM | POA: Diagnosis not present

## 2020-01-19 MED FILL — VIT D3-50 50,000 UNITS CAPS: 1.25 MG | 84 days supply | Qty: 12 | Fill #0

## 2020-01-24 ENCOUNTER — Encounter: Payer: Self-pay | Admitting: Neurology

## 2020-03-09 MED FILL — SERTRALINE HCL 100 MG TAB: 100 | 90 days supply | Qty: 90 | Fill #0

## 2020-03-09 MED FILL — ALPRAZolam 0.25 MG TABS: 0.25 | 30 days supply | Qty: 30 | Fill #1

## 2020-04-02 DIAGNOSIS — Z7251 High risk heterosexual behavior: Secondary | ICD-10-CM | POA: Diagnosis not present

## 2020-04-02 DIAGNOSIS — N926 Irregular menstruation, unspecified: Secondary | ICD-10-CM | POA: Diagnosis not present

## 2020-04-02 DIAGNOSIS — E559 Vitamin D deficiency, unspecified: Secondary | ICD-10-CM | POA: Diagnosis not present

## 2020-04-09 ENCOUNTER — Other Ambulatory Visit (HOSPITAL_COMMUNITY): Payer: Self-pay | Admitting: Physician Assistant

## 2020-04-09 MED FILL — VIT D3-50 50,000 UNITS CAPS: 1.25 MG | 84 days supply | Qty: 12 | Fill #0

## 2020-04-12 ENCOUNTER — Telehealth (HOSPITAL_COMMUNITY): Payer: 59 | Admitting: Psychiatry

## 2020-04-16 ENCOUNTER — Other Ambulatory Visit: Payer: Self-pay

## 2020-04-16 ENCOUNTER — Telehealth (HOSPITAL_COMMUNITY): Payer: 59 | Admitting: Psychiatry

## 2020-04-30 ENCOUNTER — Encounter: Payer: Self-pay | Admitting: Neurology

## 2020-04-30 ENCOUNTER — Ambulatory Visit: Payer: 59 | Admitting: Neurology

## 2020-04-30 DIAGNOSIS — Z029 Encounter for administrative examinations, unspecified: Secondary | ICD-10-CM

## 2020-06-07 MED FILL — VIT D3-50 50,000 UNITS CAPS: 1.25 MG | 84 days supply | Qty: 12 | Fill #0

## 2020-06-07 MED FILL — SERTRALINE HCL 100 MG TAB: 100 | 90 days supply | Qty: 90 | Fill #1

## 2020-06-25 ENCOUNTER — Other Ambulatory Visit: Payer: Self-pay

## 2020-06-25 ENCOUNTER — Emergency Department (HOSPITAL_COMMUNITY)
Admission: EM | Admit: 2020-06-25 | Discharge: 2020-06-25 | Disposition: A | Discharge status: Home / Self Care | Payer: 59 | Attending: Emergency Medicine | Admitting: Emergency Medicine

## 2020-06-25 ENCOUNTER — Encounter (HOSPITAL_COMMUNITY): Payer: Self-pay | Admitting: Emergency Medicine

## 2020-06-25 DIAGNOSIS — R4182 Altered mental status, unspecified: Secondary | ICD-10-CM | POA: Insufficient documentation

## 2020-06-25 DIAGNOSIS — Z5321 Procedure and treatment not carried out due to patient leaving prior to being seen by health care provider: Secondary | ICD-10-CM | POA: Insufficient documentation

## 2020-06-25 DIAGNOSIS — R569 Unspecified convulsions: Secondary | ICD-10-CM | POA: Insufficient documentation

## 2020-06-25 LAB — CBC
HCT: 43.5 % (ref 36.0–46.0)
Hemoglobin: 14.1 g/dL (ref 12.0–15.0)
MCH: 31 pg (ref 26.0–34.0)
MCHC: 32.4 g/dL (ref 30.0–36.0)
MCV: 95.6 fL (ref 80.0–100.0)
Platelets: 307 10*3/uL (ref 150–400)
RBC: 4.55 MIL/uL (ref 3.87–5.11)
RDW: 12.1 % (ref 11.5–15.5)
WBC: 6.7 10*3/uL (ref 4.0–10.5)
nRBC: 0 % (ref 0.0–0.2)

## 2020-06-25 LAB — COMPREHENSIVE METABOLIC PANEL
ALT: 16 U/L (ref 0–44)
AST: 24 U/L (ref 15–41)
Albumin: 4.1 g/dL (ref 3.5–5.0)
Alkaline Phosphatase: 83 U/L (ref 38–126)
Anion gap: 9 (ref 5–15)
BUN: 13 mg/dL (ref 6–20)
CO2: 29 mmol/L (ref 22–32)
Calcium: 10 mg/dL (ref 8.9–10.3)
Chloride: 103 mmol/L (ref 98–111)
Creatinine, Ser: 0.86 mg/dL (ref 0.44–1.00)
GFR, Estimated: >60 mL/min (ref >60)
Glucose, Bld: 86 mg/dL (ref 70–99)
Potassium: 4.4 mmol/L (ref 3.5–5.1)
Sodium: 141 mmol/L (ref 135–145)
Total Bilirubin: 0.5 mg/dL (ref 0.3–1.2)
Total Protein: 7.3 g/dL (ref 6.5–8.1)

## 2020-06-25 LAB — I-STAT BETA HCG BLOOD, ED (MC, WL, AP ONLY): I-stat hCG, quantitative: <5 m[IU]/mL (ref <5)

## 2020-06-25 NOTE — ED Triage Notes (Signed)
Pt arrives to ED with chief complaint of feeling delayed and a possible seizure.   Pt reports when she woke up yesterday she felt like she was delayed and slowly to respond to things. Her family has noticed that something has been different with her the last 2 days. Today her sister witnessed her head go back and her eye rolls back around 2 pm and it only lasted a couple seconds. She says she was back to her normal self after this event.  She is alert and ox4 in triage.

## 2020-06-25 NOTE — ED Notes (Signed)
Pt left due to not being sen quick enough

## 2020-06-29 DIAGNOSIS — F419 Anxiety disorder, unspecified: Secondary | ICD-10-CM | POA: Diagnosis not present

## 2020-06-29 DIAGNOSIS — R259 Unspecified abnormal involuntary movements: Secondary | ICD-10-CM | POA: Diagnosis not present

## 2020-06-29 DIAGNOSIS — E559 Vitamin D deficiency, unspecified: Secondary | ICD-10-CM | POA: Diagnosis not present

## 2020-06-29 DIAGNOSIS — R569 Unspecified convulsions: Secondary | ICD-10-CM | POA: Diagnosis not present

## 2020-06-29 DIAGNOSIS — F321 Major depressive disorder, single episode, moderate: Secondary | ICD-10-CM | POA: Diagnosis not present

## 2020-07-04 ENCOUNTER — Ambulatory Visit: Payer: 59 | Admitting: Neurology

## 2020-07-04 ENCOUNTER — Encounter: Payer: Self-pay | Admitting: Neurology

## 2020-07-04 VITALS — BP 106/56 | HR 68 | Ht 60.0 in | Wt 133.0 lb

## 2020-07-04 DIAGNOSIS — F952 Tourette's disorder: Secondary | ICD-10-CM | POA: Diagnosis not present

## 2020-07-04 DIAGNOSIS — G40209 Localization-related (focal) (partial) symptomatic epilepsy and epileptic syndromes with complex partial seizures, not intractable, without status epilepticus: Secondary | ICD-10-CM

## 2020-07-04 NOTE — Patient Instructions (Signed)
Pregnancy and Epilepsy  Epilepsy is a condition in which a person has repeated seizures over time. Epilepsy can cause problems during pregnancy, but with the right care, the chances of having a normal, healthy baby are good. Most women with epilepsy have normal pregnancies and healthy babies. How does this condition affect me? Women with epilepsy are more likely to have seizures during pregnancy due to hormonal changes, stress, or a lower dose of seizure medicine. The problems that may happen due to epilepsy depend on whether you take medicines for your condition during your pregnancy. Without treatment, epilepsy can put you at risk for certain problems, including:  More frequent seizures than usual.  Traumatic injuries from seizures.  An increased risk of death.  Premature labor. How does this condition affect my baby? Without treatment, epilepsy can put your baby at risk for certain problems, including:  Premature birth.  Placenta problems.  Slower than normal growth in the uterus (intrauterine growth restriction).  Slowed heart rate and low oxygen supply to the baby. Antiepileptic medicines can affect you and your baby by increasing the risk for:  Birth defects.  Growth restriction or a baby that is small.  Complications after birth that may require surgery to correct. Most of the time, medicines are prescribed anyway because the risk of the medicines harming your baby is lower than the risk of a seizure harming your baby. If your health care provider prescribes medicine, he or she will prescribe the safest kind of medicine at the lowest dose that will still prevent seizures. You will also have frequent blood tests to make sure that the medicine is at a safe level. How is this treated? During pregnancy, treatment for epilepsy may include:  Taking a medicine that helps prevent seizures (antiepileptic drug). This medicine is commonly prescribed for women who have had seizures  within several years of the pregnancy.  Creating a plan. It is best to create a plan for epilepsy before the pregnancy. Work with your health care provider and schedule prenatal appointments as soon as possible.  Keeping a seizure diary. Record what you remember about each seizure, especially anything that might have triggered the seizure. Let your health care provider know if you had a seizure.   Will I be able to breastfeed my baby? Women with epilepsy are encouraged to breastfeed. The antiepileptic drug may pass through your breast milk in small amounts and is not harmful to your baby. Talk to your baby's health care provider about medicines you are taking for seizures, as some medicines may make your baby more sleepy than usual. Follow these instructions at home:  Follow your medicine and seizure-control plans during pregnancy carefully. If you have a seizure during your pregnancy, your risk for early delivery may increase.  Keep all follow-up visits. This is important. Contact a health care provider if:  You think you are having seizures.  You do not feel the baby moving as much as usual.  You develop tiredness or weakness.  You feel like you are going to faint. Get help right away if:  You have very bad abdominal pain.  You cannot stop vomiting.  You have vaginal bleeding.  You do not feel the baby moving.  You have very bad headaches or you have vision problems.  A part of your body feels numb. Summary  Epilepsy is a condition in which a person has repeated seizures over time.  Most women with epilepsy have normal pregnancies and healthy babies.  Follow  your health care provider's recommendations about the management and treatment of epilepsy during pregnancy. This information is not intended to replace advice given to you by your health care provider. Make sure you discuss any questions you have with your health care provider. Document Revised: 01/05/2020 Document  Reviewed: 01/05/2020 Elsevier Patient Education  2021 [place]. Seizure, Adult A seizure is a sudden burst of abnormal electrical and chemical activity in the brain. Seizures usually last from 30 seconds to 2 minutes.  What are the causes? Common causes of this condition include:  Fever or infection.  Problems that affect the brain. These may include: ? A brain or head injury. ? Bleeding in the brain. ? A brain tumor.  Low levels of blood sugar or salt.  Kidney problems or liver problems.  Conditions that are passed from parent to child (are inherited).  Problems with a substance, such as: ? Having a reaction to a drug or a medicine. ? Stopping the use of a substance all of a sudden (withdrawal).  A stroke.  Disorders that affect how you develop. Sometimes, the cause may not be known.  What increases the risk?  Having someone in your family who has epilepsy. In this condition, seizures happen again and again over time. They have no clear cause.  Having had a tonic-clonic seizure before. This type of seizure causes you to: ? Tighten the muscles of the whole body. ? Lose consciousness.  Having had a head injury or strokes before.  Having had a lack of oxygen at birth. What are the signs or symptoms? There are many types of seizures. The symptoms vary depending on the type of seizure you have. Symptoms during a seizure  Shaking that you cannot control (convulsions) with fast, jerky movements of muscles.  Stiffness of the body.  Breathing problems.  Feeling mixed up (confused).  Staring or not responding to sound or touch.  Head nodding.  Eyes that blink, flutter, or move fast.  Drooling, grunting, or making clicking sounds with your mouth  Losing control of when you pee or poop. Symptoms before a seizure  Feeling afraid, nervous, or worried.  Feeling like you may vomit.  Feeling like: ? You are moving when you are not. ? Things around you are  moving when they are not.  Feeling like you saw or heard something before (dj vu).  Odd tastes or smells.  Changes in how you see. You may see flashing lights or spots. Symptoms after a seizure  Feeling confused.  Feeling sleepy.  Headache.  Sore muscles. How is this treated? If your seizure stops on its own, you will not need treatment. If your seizure lasts longer than 5 minutes, you will normally need treatment. Treatment may include:  Medicines given through an IV tube.  Avoiding things, such as medicines, that are known to cause your seizures.  Medicines to prevent seizures.  A device to prevent or control seizures.  Surgery.  A diet low in carbohydrates and high in fat (ketogenic diet). Follow these instructions at home: Medicines  Take over-the-counter and prescription medicines only as told by your doctor.  Avoid foods or drinks that may keep your medicine from working, such as alcohol. Activity  Follow instructions about driving, swimming, or doing things that would be dangerous if you had another seizure. Wait until your doctor says it is safe for you to do these things.  If you live in the [place] ask your local department of motor vehicles when you  can drive.  Get a lot of rest. Teaching others  Teach friends and family what to do when you have a seizure. They should: ? Help you get down to the ground. ? Protect your head and body. ? Loosen any clothing around your neck. ? Turn you on your side. ? Know whether or not you need emergency care. ? Stay with you until you are better.  Also, tell them what not to do if you have a seizure. Tell them: ? They should not hold you down. ? They should not put anything in your mouth.   General instructions  Avoid anything that gives you seizures.  Keep a seizure diary. Write down: ? What you remember about each seizure. ? What you think caused each seizure.  Keep all follow-up visits. Contact a doctor  if:  You have another seizure or seizures. Call the doctor each time you have a seizure.  The pattern of your seizures changes.  You keep having seizures with treatment.  You have symptoms of being sick or having an infection.  You are not able to take your medicine. Get help right away if:  You have any of these problems: ? A seizure that lasts longer than 5 minutes. ? Many seizures in a row and you do not feel better between seizures. ? A seizure that makes it harder to breathe. ? A seizure and you can no longer speak or use part of your body.  You do not wake up right after a seizure.  You get hurt during a seizure.  You feel confused or have pain right after a seizure. These symptoms may be an emergency. Get help right away. Call your local emergency services (911 in the [place]  Do not wait to see if the symptoms will go away.  Do not drive yourself to the hospital. Summary  A seizure is a sudden burst of abnormal electrical and chemical activity in the brain. Seizures normally last from 30 seconds to 2 minutes.  Causes of seizures include illness, injury to the head, low levels of blood sugar or salt, and certain conditions.  Most seizures will stop on their own in less than 5 minutes. Seizures that last longer than 5 minutes are a medical emergency and need treatment right away.  Many medicines are used to treat seizures. Take over-the-counter and prescription medicines only as told by your doctor. This information is not intended to replace advice given to you by your health care provider. Make sure you discuss any questions you have with your health care provider. Document Revised: 10/28/2019 Document Reviewed: 10/28/2019 Elsevier Patient Education  [place].

## 2020-07-04 NOTE — Progress Notes (Signed)
Provider:  Larey Seat, M D  Referring Provider: Marilynne Drivers, MD    Primary Care Physician:  [facility]  Chief Complaint  Patient presents with  . Neurologic Problem    Pt alone, rm 10. Presents today to eval. States on 2/27 she had woke up and felt "out of it" she states she was slow to respond and overall was off. States 2/28 woke up felt better, she went out with her sister who witnessed her start to have right hand movement and eyes rolled back, this happened 3 x over 10 second period per her sister. After this occurred she felt the same way she did day prior, slow to respond and out of it. Went to ER. Did not c/o workup.saw PCP next day.  . Other    She has a tourettes diagnosis and has been seeking treatment and felt this has been well controlled/managed    HPI:  Julie Warner is a 25 y.o. caucasian , right -handed  female patient and see agin here for a spell that she described as a possible seizure. Marilynne Drivers MD   NEW PROBLEM CONULTATION 07-04-2020.  She woke on Sunday 06-25-2020 in the morning feeling "delayed", slowed and " out-of-it", her sister was with her and advised her not to go to work but to MD- she was very sleepy, and appeared to her sister confused.   2 days later, at lunch , her sister witnessed another 2 spells of automatisms, her eyes were fluttering and she did repeated movements with her right and dominant hand, this ended within 10 seconds, but her hand was slower, she was tired- Todds?. We aren't  meeting to see how the Tourettes, motor tics, are going and she is producing ongoing movements but has no vocalization to it.       Initial , first encounter - consultation: seen here on 05-04-2019  Upon referral  from Dr. ED. The patient reports that during her middle school years she first developed a motor tic it was a repetitive pulling and movement of the right shoulder but also associated with turning her neck to the right.   She demonstrated this for me here and at the time primary care did not see a need to treat this or follow-up. The tics became milder but they were never resolved.  Over the years she had been placed on Zoloft she was diagnosed with depression.  She discontinued her Zoloft or run out, and then presented to the emergency room visit at time of 48 hours later after her last Zoloft dose showing twitching movements myoclonic jerks reported complex motor tics incessantly movement of the upper extremities, she also taps her left foot, her bed but was most worrisome and led to the ER visit was a slurring of speech at the time.  She could not bring out words -she could just not say what she wanted to say, bringing the words out was difficult.  There is a stammer noted today may be a light stutter which is not uncommon in a motor tic, she does not have a verbal tic however but she has had vocalizations with her take.  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 She also reports that the tics as are worse when she is in a environment that makes her feel self-conscious, a lot of people focus on her or look at her. When she is at home  by herself her tics are much less violent, she has never fallen, has not fallen out of bed at night, she has no history of seizures or loss of awareness, her movements however are involuntary.  She is not aphasic. The patient is married. 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    Review of Systems: Out of a complete 14 system review, the patient complains of only the following symptoms, and all other reviewed systems are negative.    Stammering, stutter, pressed vocalization, non verbal-  Complex repeated motor tics.    Gilles de la Tourette Syndrome-  no family history.     Social History   Socioeconomic History  . Marital status: Married    Spouse name: Not on file  . Number of children: 0  . Years of education: Not  on file  . Highest education level: Not on file  Occupational History  . Not on file  Tobacco Use  . Smoking status: Never Smoker  . Smokeless tobacco: Never Used  Vaping Use  . Vaping Use: Never used  Substance and Sexual Activity  . Alcohol use: No  . Drug use: No  . Sexual activity: Not on file  Other Topics Concern  . Not on file  Social History Narrative  . Not on file   Social Determinants of Health   Financial Resource Strain: Not on file  Food Insecurity: Not on file  Transportation Needs: Not on file  Physical Activity: Not on file  Stress: Not on file  Social Connections: Not on file  Intimate Partner Violence: Not on file    Family History  Problem Relation Age of Onset  . Alcohol abuse Mother   . Depression Mother     Past Medical History:  Diagnosis Date  . Allergy    seasonal   . Anxiety   . Depression     Past Surgical History:  Procedure Laterality Date  . ANTERIOR CRUCIATE LIGAMENT REPAIR Left 11/30/2018   Procedure: LEFT KNEE ANTERIOR CRUCIATE LIGAMENT (ACL) RECONSTRUCTION;  Surgeon: Meredith Pel, MD;  Location: [place];  Service: Orthopedics;  Laterality: Left;  . NO PAST SURGERIES      Current Outpatient Medications  Medication Sig Dispense Refill  . ALPRAZolam (XANAX) 0.25 MG tablet     . Cyanocobalamin (VITAMIN B12) 1000 MCG TBCR     . D3-50 1.25 MG (50000 UT) capsule Take 50,000 Units by mouth once a week.    . sertraline (ZOLOFT) 100 MG tablet Take 1 tablet (100 mg total) by mouth daily at 6 PM. 90 tablet 1   No current facility-administered medications for this visit.    Allergies as of 07/04/2020 - Review Complete 07/04/2020  Allergen Reaction Noted  . Lexapro [escitalopram oxalate] Other (See Comments) 12/08/2017    Vitals: BP (!) 106/56   Pulse 68   Ht 5' (1.524 m)   Wt 133 lb (60.3 kg)   BMI 25.97 kg/m  Last Weight:  Wt Readings from Last 1 Encounters:  07/04/20 133 lb (60.3 kg)   Last Height:   Ht Readings  from Last 1 Encounters:  07/04/20 5' (1.524 m)    Physical exam:  General: The patient is awake, alert and appears not in acute distress. The patient is well groomed. Head: Normocephalic, atraumatic. Neck is supple. Mallampati: 2 neck circumference: 14 inches.  Cardiovascular:  Regular rate and rhythm, without  murmurs or carotid bruit, and without distended neck veins. Respiratory: Lungs are clear  to auscultation. Skin:  Without evidence of edema, or rash Trunk: BMI  26 kg/m2  Neurologic exam : The patient is awake and alert, oriented to place and time.  Memory subjective described as intact. There is a normal attention span & concentration ability. Speech is fluent with  Dysarthria - stammering, stutter and pressed speech- dysphonia - not  aphasia. Mood and affect are appropriate.  Cranial nerves: Pupils are equal and briskly reactive to light. Funduscopic exam without  evidence of pallor or edema. Extraocular movements  in vertical and horizontal planes intact and without nystagmus.  Visual fields by finger perimetry are intact. Hearing to finger rub intact.   Facial sensation intact to fine touch. Facial motor strength is symmetric and tongue and uvula move midline. Tongue protrusion into either cheek is normal. Shoulder shrug is normal.   Motor exam: Normal tone ,muscle bulk and symmetric  strength in all extremities. The patient was presenting here while having ,motor tics, repeatedly.   Sensory:  Fine touch, pinprick and vibration were tested in all extremities. Proprioception was normal.  Coordination: Rapid alternating movements in the fingers/hands were normal.  Finger-to-nose maneuver  normal without evidence of ataxia, dysmetria or tremor.  Gait and station: Patient walks without assistive device and is able unassisted to climb up to the exam table. Strength within normal limits. Stance is stable and normal. Tandem gait is unfragmented.  Romberg testing is negative   Deep  tendon reflexes: in the upper and lower extremities are symmetric and intact. Babinski maneuver response deferred.    MRI brain in ED was negative- reviewed images here.  CT head was negative -  She has weaned off medication by September in order to conceive.     Assessment:  After physical and neurologic examination, review of laboratory studies, imaging, neurophysiology testing and pre-existing records, assessment is that of :   1) the patient presents with spells , automatism, tic like movements, and confusion, followed by feeling extremely tiredness- exhaustion,  and possible Todd's of the affected dominant hand- and weakness of this hand- ,  the side of involuntary automatism, lip smacking, eyes flutter.   1) INVOLUNTARY MOVEMENTS / Complex MOTOR TICS WITH AND WITHOUT SIMPLE VOCAL TICS/  No VERBALIZATION: improved - still suspect Tourettes syndrome. Today , here  There were no longer vocalization present. reports Mostly whistles, lip popping.     Plan:  Treatment plan and additional workup :  EEG ASAP and then order for a 72 hour EEG -or EMU.   RV in 6 weeks     Larey Seat MD 07/04/2020

## 2020-08-01 ENCOUNTER — Ambulatory Visit: Payer: 59 | Admitting: Neurology

## 2020-08-01 DIAGNOSIS — F952 Tourette's disorder: Secondary | ICD-10-CM

## 2020-08-01 DIAGNOSIS — G40209 Localization-related (focal) (partial) symptomatic epilepsy and epileptic syndromes with complex partial seizures, not intractable, without status epilepticus: Secondary | ICD-10-CM

## 2020-08-08 ENCOUNTER — Ambulatory Visit: Payer: 59 | Admitting: Neurology

## 2020-08-10 NOTE — Progress Notes (Signed)
There were no changes to the rate or rhythm of the EEG.  Positive photic entrainment was noted :this is a physiologic response.  The patient soon entered briefly light sleep stages indicated by the presence of K-complexes. Sleep stages N1 and N2 were recorded for several minutes.   A regular EKG was noted throughout this recording, and  no electrographic activity of focal, rhythmic or periodic abnormalities was noted.    Conclusion: this is a normal EEG.

## 2020-08-10 NOTE — Procedures (Signed)
This EEG with a duration of 1 hour and 2 minutes was performed following the international system of electrode placement also called the 1020 system.  A posterior dominant rhythm consisted of 10 Hz with a medium high amplitude and several eye blink artifacts.  Hyperventilation was soon following and led to a significant amplitude buildup in a symmetric fashion.  There were no changes to the rate or rhythm of the EEG.  Positive photic entrainment was noted this is a physiologic response.  The patient soon entered briefly light sleep stages indicated by the presence of K-complexes. Sleep stages N1 and N2 were recorded for several minutes.   A regular EKG was noted throughout this recording, and  no electrographic activity of focal, rhythmic or periodic abnormalities was noted.    Conclusion: this is a normal EEG.  Larey Seat, MD

## 2020-08-14 ENCOUNTER — Encounter: Payer: Self-pay | Admitting: Neurology

## 2020-08-14 ENCOUNTER — Telehealth: Payer: Self-pay | Admitting: Neurology

## 2020-08-14 NOTE — Telephone Encounter (Signed)
-----   Message from Larey Seat, MD sent at 08/10/2020  3:27 PM EDT ----- There were no changes to the rate or rhythm of the EEG.  Positive photic entrainment was noted :this is a physiologic response.  The patient soon entered briefly light sleep stages indicated by the presence of K-complexes. Sleep stages N1 and N2 were recorded for several minutes.   A regular EKG was noted throughout this recording, and  no electrographic activity of focal, rhythmic or periodic abnormalities was noted.    Conclusion: this is a normal EEG.

## 2020-08-14 NOTE — Telephone Encounter (Signed)
Called the pt to review EEG results there was no answer. LVM for the pt to call back. Advised a mychart message would also be sent to her.   ** If pt calls back please advise the EEG results were normal. There was no evidence of seizure like activity.

## 2020-11-21 DIAGNOSIS — Z789 Other specified health status: Secondary | ICD-10-CM | POA: Diagnosis not present

## 2020-11-21 DIAGNOSIS — Z01419 Encounter for gynecological examination (general) (routine) without abnormal findings: Secondary | ICD-10-CM | POA: Diagnosis not present

## 2021-02-06 DIAGNOSIS — Z3201 Encounter for pregnancy test, result positive: Secondary | ICD-10-CM | POA: Diagnosis not present

## 2021-02-09 ENCOUNTER — Other Ambulatory Visit: Payer: Self-pay

## 2021-02-09 ENCOUNTER — Inpatient Hospital Stay (HOSPITAL_COMMUNITY)
Admission: AD | Admit: 2021-02-09 | Discharge: 2021-02-09 | Disposition: A | Discharge status: Home / Self Care | Payer: 59 | Attending: Obstetrics and Gynecology | Admitting: Obstetrics and Gynecology

## 2021-02-09 DIAGNOSIS — O039 Complete or unspecified spontaneous abortion without complication: Secondary | ICD-10-CM | POA: Insufficient documentation

## 2021-02-09 DIAGNOSIS — Z79899 Other long term (current) drug therapy: Secondary | ICD-10-CM | POA: Diagnosis not present

## 2021-02-09 DIAGNOSIS — Z888 Allergy status to other drugs, medicaments and biological substances status: Secondary | ICD-10-CM | POA: Insufficient documentation

## 2021-02-09 DIAGNOSIS — N926 Irregular menstruation, unspecified: Secondary | ICD-10-CM | POA: Diagnosis not present

## 2021-02-09 LAB — HCG, QUANTITATIVE, PREGNANCY: hCG, Beta Chain, Quant, S: 5 m[IU]/mL — ABNORMAL HIGH (ref <5)

## 2021-02-09 LAB — POCT PREGNANCY, URINE: Preg Test, Ur: NEGATIVE

## 2021-02-09 NOTE — Discharge Instructions (Signed)
Return if you have severe pain or very heavy vaginal bleeding. Nothing in the vagina  - no sex, no tampons, no douching. See your provider in two weeks.

## 2021-02-09 NOTE — MAU Note (Signed)
Presents stating she's having right lower abdominal pain and VB that started last night but worsened this morning @ 0900.  States VB similar to menstrual cycle, but not saturating through sanitary napkins.  LMP 12/26/2020.

## 2021-02-09 NOTE — MAU Provider Note (Signed)
History     CSN: [id number]  Arrival date and time: 02/09/21 1136  Seen by provider at 1445    Chief Complaint  Patient presents with   Abdominal Pain   Vaginal Bleeding   HPI Julie Warner 25 y.o.  Comes to MAU today with right sided abdominal pain and vaginal bleeding.  Is trying to be pregnant and had a slightly positive pregnancy test this week.    Has a history of irregular menses when she is not on pills.  Has been off pills for about one year, and having irregular menses since April 2022.  Thinks she had a miscarriage in June, 2022 but did not receive any medical care.    Began having bleeding today with significant right sided pain that is now much less.  Urine pregnancy test is negative.  OB History   No obstetric history on file.     Past Medical History:  Diagnosis Date   Allergy    seasonal    Anxiety    Depression     Past Surgical History:  Procedure Laterality Date   ANTERIOR CRUCIATE LIGAMENT REPAIR Left 11/30/2018   Procedure: LEFT KNEE ANTERIOR CRUCIATE LIGAMENT (ACL) RECONSTRUCTION;  Surgeon: Meredith Pel, MD;  Location: [place];  Service: Orthopedics;  Laterality: Left;   NO PAST SURGERIES      Family History  Problem Relation Age of Onset   Alcohol abuse Mother    Depression Mother     Social History   Tobacco Use   Smoking status: Never   Smokeless tobacco: Never  Vaping Use   Vaping Use: Never used  Substance Use Topics   Alcohol use: No   Drug use: No    Allergies:  Allergies  Allergen Reactions   Lexapro [Escitalopram Oxalate] Other (See Comments)    stroke    Medications Prior to Admission  Medication Sig Dispense Refill Last Dose   ALPRAZolam (XANAX) 0.25 MG tablet       Cholecalciferol 1.25 MG (50000 UT) capsule TAKE 1 CAPSULE BY MOUTH ONCE A WEEK 12 capsule 0    Cyanocobalamin (VITAMIN B12) 1000 MCG TBCR       D3-50 1.25 MG (50000 UT) capsule Take 50,000 Units by mouth once a week.      sertraline (ZOLOFT) 100  MG tablet Take 1 tablet (100 mg total) by mouth daily at 6 PM. 90 tablet 1    sertraline (ZOLOFT) 100 MG tablet TAKE 1 TABLET BY MOUTH ONCE DAILY 90 tablet 1     Review of Systems  Constitutional:  Negative for fever.  Respiratory:  Negative for cough and shortness of breath.   Gastrointestinal:  Positive for abdominal pain.  Genitourinary:  Positive for vaginal bleeding. Negative for vaginal discharge.  Physical Exam   Blood pressure 113/67, pulse 68, temperature 98.1 F (36.7 C), temperature source Oral, resp. rate 18, height 5' (1.524 m), weight 61.7 kg, last menstrual period 12/26/2020, SpO2 99 %.  Physical Exam GENERAL: Well-developed, well-nourished female in no acute distress.  HEENT: Normocephalic, atraumatic.  LUNGS: Effort normal  HEART: Regular rate  SKIN: Warm, dry and without erythema  PSYCH: Normal mood and affect  MAU Course  Procedures LABS Results for orders placed or performed during the hospital encounter of 02/09/21 (from the past 24 hour(s))  Pregnancy, urine POC     Status: None   Collection Time: 02/09/21 11:58 AM  Result Value Ref Range   Preg Test, Ur NEGATIVE NEGATIVE  hCG, quantitative,  pregnancy     Status: Abnormal   Collection Time: 02/09/21 12:29 PM  Result Value Ref Range   hCG, Beta Chain, Quant, S 5 (H) <5 mIU/mL     MDM Consult with Dr. Damita Dunnings as Julie Warner was 5 (not <5) No further evaluation at this time since patient's symptoms are much less and her lab is almost zero.  Assessment and Plan  Miscarriage Irregular periods  Plan Return if you have severe pain or very heavy vaginal bleeding. Nothing in the vagina  - no sex, no tampons, no douching. See your provider in two weeks as you are actively trying to be pregnant. - Dr. Landry Mellow Continue your prenatal vitamins   Javari Bufkin L Marquan Vokes 02/09/2021, 2:55 PM

## 2021-02-19 ENCOUNTER — Other Ambulatory Visit (HOSPITAL_COMMUNITY): Payer: Self-pay

## 2021-02-19 MED ORDER — ALPRAZOLAM 0.25 MG PO TABS
0.2500 mg | ORAL_TABLET | Freq: Two times a day (BID) | ORAL | 0 refills | Status: DC | PRN
  Filled 2021-02-19: qty 30, 15d supply, fill #0

## 2021-02-19 MED ORDER — SERTRALINE HCL 100 MG PO TABS
100.0000 mg | ORAL_TABLET | Freq: Every day | ORAL | 1 refills | Status: DC
  Filled 2021-02-19: qty 90, 90d supply, fill #0

## 2021-03-01 DIAGNOSIS — O039 Complete or unspecified spontaneous abortion without complication: Secondary | ICD-10-CM | POA: Diagnosis not present

## 2021-03-15 DIAGNOSIS — Z8759 Personal history of other complications of pregnancy, childbirth and the puerperium: Secondary | ICD-10-CM | POA: Diagnosis not present

## 2021-03-15 DIAGNOSIS — Z3202 Encounter for pregnancy test, result negative: Secondary | ICD-10-CM | POA: Diagnosis not present

## 2021-05-10 DIAGNOSIS — R5383 Other fatigue: Secondary | ICD-10-CM | POA: Diagnosis not present

## 2021-05-10 DIAGNOSIS — E538 Deficiency of other specified B group vitamins: Secondary | ICD-10-CM | POA: Diagnosis not present

## 2021-05-10 DIAGNOSIS — Z03818 Encounter for observation for suspected exposure to other biological agents ruled out: Secondary | ICD-10-CM | POA: Diagnosis not present

## 2021-05-10 DIAGNOSIS — R110 Nausea: Secondary | ICD-10-CM | POA: Diagnosis not present

## 2021-05-27 DIAGNOSIS — O200 Threatened abortion: Secondary | ICD-10-CM | POA: Diagnosis not present

## 2021-05-28 DIAGNOSIS — O09291 Supervision of pregnancy with other poor reproductive or obstetric history, first trimester: Secondary | ICD-10-CM | POA: Diagnosis not present

## 2021-05-28 DIAGNOSIS — O26851 Spotting complicating pregnancy, first trimester: Secondary | ICD-10-CM | POA: Diagnosis not present

## 2021-05-30 DIAGNOSIS — O200 Threatened abortion: Secondary | ICD-10-CM | POA: Diagnosis not present

## 2021-06-04 ENCOUNTER — Other Ambulatory Visit (HOSPITAL_COMMUNITY): Payer: Self-pay

## 2021-06-04 DIAGNOSIS — Z3400 Encounter for supervision of normal first pregnancy, unspecified trimester: Secondary | ICD-10-CM | POA: Diagnosis not present

## 2021-06-04 LAB — OB RESULTS CONSOLE HIV ANTIBODY (ROUTINE TESTING): HIV: NONREACTIVE

## 2021-06-04 LAB — OB RESULTS CONSOLE RUBELLA ANTIBODY, IGM: Rubella: NON-IMMUNE/NOT IMMUNE

## 2021-06-04 LAB — OB RESULTS CONSOLE ABO/RH: RH Type: POSITIVE

## 2021-06-04 LAB — OB RESULTS CONSOLE RPR: RPR: NONREACTIVE

## 2021-06-04 LAB — OB RESULTS CONSOLE HEPATITIS B SURFACE ANTIGEN: Hepatitis B Surface Ag: NEGATIVE

## 2021-06-04 MED ORDER — ONDANSETRON 8 MG PO TBDP
8.0000 mg | ORAL_TABLET | Freq: Three times a day (TID) | ORAL | 3 refills | Status: DC | PRN
  Filled 2021-06-04: qty 21, 7d supply, fill #0
  Filled 2021-06-13: qty 21, 7d supply, fill #1
  Filled 2021-07-03: qty 21, 7d supply, fill #2

## 2021-06-11 DIAGNOSIS — Z3481 Encounter for supervision of other normal pregnancy, first trimester: Secondary | ICD-10-CM | POA: Diagnosis not present

## 2021-06-11 DIAGNOSIS — O368310 Maternal care for abnormalities of the fetal heart rate or rhythm, first trimester, not applicable or unspecified: Secondary | ICD-10-CM | POA: Diagnosis not present

## 2021-06-11 LAB — OB RESULTS CONSOLE GC/CHLAMYDIA
Chlamydia: NEGATIVE
Neisseria Gonorrhea: NEGATIVE

## 2021-06-13 ENCOUNTER — Other Ambulatory Visit (HOSPITAL_COMMUNITY): Payer: Self-pay

## 2021-07-03 ENCOUNTER — Other Ambulatory Visit (HOSPITAL_COMMUNITY): Payer: Self-pay

## 2021-10-28 LAB — HEPATITIS C ANTIBODY: HCV Ab: NEGATIVE

## 2021-10-31 LAB — OB RESULTS CONSOLE RPR: RPR: NONREACTIVE

## 2021-12-20 LAB — OB RESULTS CONSOLE GBS: GBS: NEGATIVE

## 2022-01-13 NOTE — H&P (Signed)
HPI: 26 y.o. G4P0030 at 38w3destimated gestational age (as dated by LMP c/w 6 week ultrasound) presents for induction of labor at term.  Leakage of fluid:  No Vaginal bleeding:  No Contractions:  No Fetal movement:  Yes  Prenatal care has [facility] provided [facility] Dr[facility] MWellington Hampshire Al[facility]  ROS:  Denies fevers, chills, chest pain, visual changes, SOB, RUQ/epigastric pain, N/V, dysuria, hematuria, or sudden onset/worsening bilateral LE or facial edema.  Pregnancy complicated by: Tourette's Syndrome (stable, no medications) Rubella Non-immune (MMR postpartum) Hx migraines Anxiety (stable, no medications)  Prenatal Transfer Tool  Maternal Diabetes: No Genetic Screening: Declined Maternal Ultrasounds/Referrals: Normal Fetal Ultrasounds or other Referrals:  Other: Normal anatomy Maternal Substance Abuse:  No Significant Maternal Medications:  None Significant Maternal Lab Results: Group B Strep negative   Prenatal Labs Blood type:  O Positive Antibody screen:  Negative CBC:  H/H 11.6/33.5 Rubella: Immune RPR:  Non-reactive Hep B:  Negative Hep C:  Negative HIV:  Negative GC/CT:  Negative Glucola:  65.9 (wnl)  Immunizations: Tdap: Given prenatally Flu: No  OBHx:  OB History   No obstetric history on file.    PMHx:  See above Meds:  PNV Allergy:   Allergies  Allergen Reactions   Lexapro [Escitalopram Oxalate] Other (See Comments)    stroke   SurgHx: None SocHx:   Denies Tobacco, ETOH, illicit drugs  O: There were no vitals taken for this visit. Gen. AAOx3, NAD CV.  RRR  Resp. CTAB, no wheezes/rales/rhonchi Abd. Gravid, soft, non-tender throughout, no rebound/guarding Extr.  No bilateral LE edema, no calf tenderness bilaterally SVE (9/1): 0.5/thick/high  Last [place](9/13): EFW 3284g, 7lbs 4oz (25%), FL less than 2%ile, AAFV, cephalic, posterior placenta   Labs: see orders  A/P:  26y.o. G4P0030 at 450w3dho is admitted for induction of labor at  term.  - Admit to L&D - Admit labs (CBC, T&S, COVID screen) - CEFM/Toco - Diet:  Clear liquids - IVF:  LR at 125cc/hour - VTE Prophylaxis:  SCDs - GBS Status:  Negative - Presentation:  Confirm prior to IOL - Pain control:  Per patient request - Induction method:  Cytotec for cervical ripening vaginally - Anticipate SVD  Due to induction of labor delay (full L&D census), H&P was edited to state that patient is now 2639w3don admission.  MelDrema DallasO

## 2022-01-14 ENCOUNTER — Other Ambulatory Visit (HOSPITAL_COMMUNITY): Payer: Self-pay

## 2022-01-14 MED ORDER — BUTALBITAL-APAP-CAFFEINE 50-300-40 MG PO CAPS
1.0000 | ORAL_CAPSULE | Freq: Four times a day (QID) | ORAL | 0 refills | Status: AC | PRN
  Filled 2022-01-14: qty 30, 8d supply, fill #0

## 2022-01-16 ENCOUNTER — Encounter (HOSPITAL_COMMUNITY): Payer: Self-pay | Admitting: *Deleted

## 2022-01-16 ENCOUNTER — Telehealth (HOSPITAL_COMMUNITY): Payer: Self-pay | Admitting: *Deleted

## 2022-01-16 NOTE — Telephone Encounter (Signed)
Preadmission screen  

## 2022-01-17 ENCOUNTER — Encounter (HOSPITAL_COMMUNITY): Payer: Self-pay | Admitting: *Deleted

## 2022-01-17 ENCOUNTER — Telehealth (HOSPITAL_COMMUNITY): Payer: Self-pay | Admitting: *Deleted

## 2022-01-17 NOTE — Telephone Encounter (Signed)
Preadmission screen  

## 2022-01-18 ENCOUNTER — Inpatient Hospital Stay (HOSPITAL_COMMUNITY): Payer: Medicaid Other

## 2022-01-19 ENCOUNTER — Other Ambulatory Visit: Payer: Self-pay

## 2022-01-19 ENCOUNTER — Encounter (HOSPITAL_COMMUNITY): Payer: Self-pay | Admitting: Obstetrics and Gynecology

## 2022-01-19 ENCOUNTER — Inpatient Hospital Stay (HOSPITAL_COMMUNITY)
Admission: AD | Admit: 2022-01-19 | Discharge: 2022-01-23 | DRG: 787 | Disposition: A | Discharge status: Home / Self Care | Payer: Medicaid Other | Attending: Obstetrics and Gynecology | Admitting: Obstetrics and Gynecology

## 2022-01-19 DIAGNOSIS — O1205 Gestational edema, complicating the puerperium: Secondary | ICD-10-CM | POA: Diagnosis present

## 2022-01-19 DIAGNOSIS — Z3A40 40 weeks gestation of pregnancy: Secondary | ICD-10-CM

## 2022-01-19 DIAGNOSIS — D62 Acute posthemorrhagic anemia: Secondary | ICD-10-CM | POA: Diagnosis not present

## 2022-01-19 DIAGNOSIS — F418 Other specified anxiety disorders: Secondary | ICD-10-CM | POA: Diagnosis not present

## 2022-01-19 DIAGNOSIS — O3663X0 Maternal care for excessive fetal growth, third trimester, not applicable or unspecified: Secondary | ICD-10-CM | POA: Diagnosis present

## 2022-01-19 DIAGNOSIS — Z3490 Encounter for supervision of normal pregnancy, unspecified, unspecified trimester: Principal | ICD-10-CM | POA: Diagnosis present

## 2022-01-19 DIAGNOSIS — O9081 Anemia of the puerperium: Secondary | ICD-10-CM | POA: Diagnosis not present

## 2022-01-19 DIAGNOSIS — O480 Post-term pregnancy: Principal | ICD-10-CM | POA: Diagnosis present

## 2022-01-19 DIAGNOSIS — O99344 Other mental disorders complicating childbirth: Secondary | ICD-10-CM | POA: Diagnosis not present

## 2022-01-19 DIAGNOSIS — Z23 Encounter for immunization: Secondary | ICD-10-CM | POA: Diagnosis not present

## 2022-01-19 DIAGNOSIS — O3663X1 Maternal care for excessive fetal growth, third trimester, fetus 1: Secondary | ICD-10-CM | POA: Diagnosis not present

## 2022-01-19 HISTORY — DX: Tourette's disorder: F95.2

## 2022-01-19 HISTORY — DX: Migraine, unspecified, not intractable, without status migrainosus: G43.909

## 2022-01-19 LAB — CBC
HCT: 35.8 % — ABNORMAL LOW (ref 36.0–46.0)
Hemoglobin: 11.7 g/dL — ABNORMAL LOW (ref 12.0–15.0)
MCH: 29.9 pg (ref 26.0–34.0)
MCHC: 32.7 g/dL (ref 30.0–36.0)
MCV: 91.6 fL (ref 80.0–100.0)
Platelets: 340 10*3/uL (ref 150–400)
RBC: 3.91 MIL/uL (ref 3.87–5.11)
RDW: 12.6 % (ref 11.5–15.5)
WBC: 11.2 10*3/uL — ABNORMAL HIGH (ref 4.0–10.5)
nRBC: 0 % (ref 0.0–0.2)

## 2022-01-19 MED ORDER — OXYTOCIN-SODIUM CHLORIDE 30-0.9 UT/500ML-% IV SOLN
2.5000 [IU]/h | INTRAVENOUS | Status: DC
  Filled 2022-01-19: qty 500

## 2022-01-19 MED ORDER — OXYCODONE-ACETAMINOPHEN 5-325 MG PO TABS: 2.0000 | ORAL_TABLET | ORAL | Status: DC | PRN

## 2022-01-19 MED ORDER — MISOPROSTOL 25 MCG QUARTER TABLET
25.0000 ug | ORAL_TABLET | ORAL | Status: AC
  Administered 2022-01-19: 25 ug via VAGINAL
  Filled 2022-01-19 (×2): qty 1

## 2022-01-19 MED ORDER — ONDANSETRON HCL 4 MG/2ML IJ SOLN
4.0000 mg | Freq: Four times a day (QID) | INTRAMUSCULAR | Status: DC | PRN
  Administered 2022-01-20: 4 mg via INTRAVENOUS
  Filled 2022-01-19: qty 2

## 2022-01-19 MED ORDER — OXYTOCIN-SODIUM CHLORIDE 30-0.9 UT/500ML-% IV SOLN
1.0000 m[IU]/min | INTRAVENOUS | Status: DC
  Administered 2022-01-20: 2 m[IU]/min via INTRAVENOUS

## 2022-01-19 MED ORDER — LACTATED RINGERS IV SOLN
500.0000 mL | INTRAVENOUS | Status: DC | PRN
  Administered 2022-01-20 (×2): 500 mL via INTRAVENOUS

## 2022-01-19 MED ORDER — ACETAMINOPHEN 325 MG PO TABS: 650.0000 mg | ORAL_TABLET | ORAL | Status: DC | PRN

## 2022-01-19 MED ORDER — SOD CITRATE-CITRIC ACID 500-334 MG/5ML PO SOLN
30.0000 mL | ORAL | Status: DC | PRN
  Administered 2022-01-21: 30 mL via ORAL
  Filled 2022-01-19: qty 30

## 2022-01-19 MED ORDER — OXYCODONE-ACETAMINOPHEN 5-325 MG PO TABS: 1.0000 | ORAL_TABLET | ORAL | Status: DC | PRN

## 2022-01-19 MED ORDER — LIDOCAINE HCL (PF) 1 % IJ SOLN: 30.0000 mL | INTRAMUSCULAR | Status: DC | PRN

## 2022-01-19 MED ORDER — FENTANYL CITRATE (PF) 100 MCG/2ML IJ SOLN: 50.0000 ug | INTRAMUSCULAR | Status: DC | PRN

## 2022-01-19 MED ORDER — OXYTOCIN BOLUS FROM INFUSION: 333.0000 mL | Freq: Once | INTRAVENOUS | Status: DC

## 2022-01-19 MED ORDER — TERBUTALINE SULFATE 1 MG/ML IJ SOLN: 0.2500 mg | Freq: Once | INTRAMUSCULAR | Status: DC | PRN

## 2022-01-19 MED ORDER — HYDROXYZINE HCL 50 MG PO TABS: 50.0000 mg | ORAL_TABLET | Freq: Four times a day (QID) | ORAL | Status: DC | PRN

## 2022-01-19 MED ORDER — LACTATED RINGERS IV SOLN: INTRAVENOUS | Status: DC

## 2022-01-20 ENCOUNTER — Inpatient Hospital Stay (HOSPITAL_COMMUNITY): Payer: Medicaid Other | Admitting: Anesthesiology

## 2022-01-20 LAB — TYPE AND SCREEN
ABO/RH(D): O POS
Antibody Screen: NEGATIVE

## 2022-01-20 LAB — RPR: RPR Ser Ql: NONREACTIVE

## 2022-01-20 LAB — GLUCOSE, CAPILLARY: Glucose-Capillary: 71 mg/dL (ref 70–99)

## 2022-01-20 MED ORDER — LACTATED RINGERS AMNIOINFUSION: INTRAVENOUS | Status: DC

## 2022-01-20 MED ORDER — OXYTOCIN-SODIUM CHLORIDE 30-0.9 UT/500ML-% IV SOLN
1.0000 m[IU]/min | INTRAVENOUS | Status: DC
  Administered 2022-01-20: 1 m[IU]/min via INTRAVENOUS

## 2022-01-20 MED ORDER — DIPHENHYDRAMINE HCL 50 MG/ML IJ SOLN: 12.5000 mg | INTRAMUSCULAR | Status: DC | PRN

## 2022-01-20 MED ORDER — LIDOCAINE HCL (PF) 1 % IJ SOLN
INTRAMUSCULAR | Status: DC | PRN
  Administered 2022-01-20 (×2): 5 mL via EPIDURAL

## 2022-01-20 MED ORDER — LACTATED RINGERS IV SOLN: 500.0000 mL | Freq: Once | INTRAVENOUS | Status: DC

## 2022-01-20 MED ORDER — PHENYLEPHRINE 80 MCG/ML (10ML) SYRINGE FOR IV PUSH (FOR BLOOD PRESSURE SUPPORT): 80.0000 ug | PREFILLED_SYRINGE | INTRAVENOUS | Status: DC | PRN

## 2022-01-20 MED ORDER — TERBUTALINE SULFATE 1 MG/ML IJ SOLN: 0.2500 mg | Freq: Once | INTRAMUSCULAR | Status: DC | PRN

## 2022-01-20 MED ORDER — FENTANYL-BUPIVACAINE-NACL 0.5-0.125-0.9 MG/250ML-% EP SOLN
12.0000 mL/h | EPIDURAL | Status: DC | PRN
  Administered 2022-01-20: 12 mL/h via EPIDURAL
  Filled 2022-01-20: qty 250

## 2022-01-20 MED ORDER — EPHEDRINE 5 MG/ML INJ: 10.0000 mg | INTRAVENOUS | Status: DC | PRN

## 2022-01-20 NOTE — Progress Notes (Signed)
OB Progress Note  S: Patient ambulating. Feeling pain with contractions.  O: BP 113/68   Pulse 86   Temp 98.4 F (36.9 C) (Oral)   Resp 16   Ht 5' (1.524 m)   Wt 83.9 kg   LMP 12/26/2020   BMI 36.13 kg/m   FHT: 145bpm, moderate variablity, + accels, - decels Toco: q1 minute SVE: deferred, previously 3/50/-3 by primary RN at 0509  A/P: 26 y.o. G4P0030 @ 59w4dadmitted for induction of labor at term.  FWB: Cat. I Labor course: S/p vaginal cytotec 225m x 1 dose, Pitocin currently at 41m51min Pain: Per patient request, desires epidural later GBS: Negative Anticipate SVD  MelDrema DallasO

## 2022-01-20 NOTE — Progress Notes (Signed)
FHR reviewed remotely.  FHR baseline 120's decreased variability. Decelerations have resolved. S/p discontinuation of pitocin and IV fluid bolus.  Will monitor closely

## 2022-01-20 NOTE — Progress Notes (Signed)
  Subjective: Patient is comfortable with her epidural   Objective: BP (!) 102/53   Pulse 86   Temp 98.3 F (36.8 C) (Oral)   Resp 16   Ht 5' (1.524 m)   Wt 83.9 kg   LMP 12/26/2020   SpO2 99%   BMI 36.13 kg/m  No intake/output data recorded. No intake/output data recorded.  FHT:  FHR: 125 bpm, variability: moderate,  accelerations:  Present,  decelerations:  Absent UC:   regular, every 1-3 minutes on 10 mu of pitocin  SVE:  4/60/-3 AROM thick meconium   Labs: Lab Results  Component Value Date   WBC 11.2 (H) 01/19/2022   HGB 11.7 (L) 01/19/2022   HCT 35.8 (L) 01/19/2022   MCV 91.6 01/19/2022   PLT 340 01/19/2022    Assessment / Plan: Induction of labor due to term with favorable cervix,  progressing well on pitocin  Labor:  progressing on pitocin arom performed. Thick meconium start amnioinfusion  Preeclampsia:   NA Fetal Wellbeing:  Category I Pain Control:  Epidural I/D:  n/a Anticipated MOD:  NSVD  Christophe Louis, MD 01/20/2022, 1:35 PM

## 2022-01-20 NOTE — Anesthesia Preprocedure Evaluation (Signed)
Anesthesia Evaluation  Patient identified by MRN, date of birth, ID band Patient awake    Reviewed: Allergy & Precautions, NPO status , Patient's Chart, lab work & pertinent test results  Airway Mallampati: II  TM Distance: >3 FB Neck ROM: Full    Dental no notable dental hx. (+) Dental Advisory Given   Pulmonary neg pulmonary ROS,    Pulmonary exam normal        Cardiovascular negative cardio ROS Normal cardiovascular exam     Neuro/Psych PSYCHIATRIC DISORDERS Anxiety Depression negative neurological ROS     GI/Hepatic negative GI ROS, Neg liver ROS,   Endo/Other  negative endocrine ROS  Renal/GU negative Renal ROS  negative genitourinary   Musculoskeletal negative musculoskeletal ROS (+)   Abdominal   Peds negative pediatric ROS (+)  Hematology negative hematology ROS (+)   Anesthesia Other Findings   Reproductive/Obstetrics (+) Pregnancy                             Anesthesia Physical Anesthesia Plan  ASA: 2  Anesthesia Plan: Epidural   Post-op Pain Management:    Induction:   PONV Risk Score and Plan:   Airway Management Planned: Natural Airway  Additional Equipment:   Intra-op Plan:   Post-operative Plan:   Informed Consent: I have reviewed the patients History and Physical, chart, labs and discussed the procedure including the risks, benefits and alternatives for the proposed anesthesia with the patient or authorized representative who has indicated his/her understanding and acceptance.     Dental advisory given  Plan Discussed with: Anesthesiologist  Anesthesia Plan Comments:         Anesthesia Quick Evaluation

## 2022-01-20 NOTE — Progress Notes (Signed)
  Subjective: Patient is comfortable with her epidural.    Objective: BP (!) 92/57   Pulse 98   Temp 98.1 F (36.7 C) (Oral)   Resp 16   Ht 5' (1.524 m)   Wt 83.9 kg   LMP 12/26/2020   SpO2 99%   BMI 36.13 kg/m  No intake/output data recorded. No intake/output data recorded.  FHT:  FHR: 125 bpm, variability: moderate,  accelerations:  Present,  decelerations:  Absent UC:   regular, every 1-4 minutes SVE:   Dilation: 8 Effacement (%): 90 Station: -2 Exam by:: Jaasiel Hollyfield  Labs: Lab Results  Component Value Date   WBC 11.2 (H) 01/19/2022   HGB 11.7 (L) 01/19/2022   HCT 35.8 (L) 01/19/2022   MCV 91.6 01/19/2022   PLT 340 01/19/2022    Assessment / Plan: Induction of labor due to term   Labor:  progressing. Plan to recheck in 1-2 hours if no change and fhr is reassuring plan to restart pitocin 1x1 to reach mvu 200  Preeclampsia:   NA Fetal Wellbeing:  Category I and Category II  currently category 1  Pain Control:  Epidural I/D:  n/a Anticipated MOD:  NSVD  Christophe Louis, MD 01/20/2022, 6:14 PM

## 2022-01-20 NOTE — Progress Notes (Signed)
FHR reviewed remotely.  FHR baseline 130's with minimal variability , early variable and late decelerations present.  Order given to discontinue pitocin and give 500 cc bolus of fluid.  If fhr tracing does not become reassuring plan for terbutaline.  Will reevaluate in 30 minutes.

## 2022-01-20 NOTE — Progress Notes (Signed)
Discussed with patient the option of restarting pitocin; patient and family inquired about variability and baby's minimal to moderate variability. RN and patient came up with plan to give IVFB and see if we can increase baby's current variability before restarting pitocin. Will reassess restarting pitocin after 540m IVFB per patient request. VD RN

## 2022-01-20 NOTE — Anesthesia Procedure Notes (Addendum)
Epidural Patient location during procedure: OB Start time: 01/20/2022 12:07 PM End time: 01/20/2022 12:17 PM  Staffing Anesthesiologist: Duane Boston, MD Performed: anesthesiologist   Preanesthetic Checklist Completed: patient identified, IV checked, site marked, risks and benefits discussed, monitors and equipment checked, pre-op evaluation and timeout performed  Epidural Patient position: sitting Prep: DuraPrep Patient monitoring: heart rate, cardiac monitor, continuous pulse ox and blood pressure Approach: midline Location: L2-L3 Injection technique: LOR saline  Needle:  Needle type: Tuohy  Needle gauge: 17 G Needle length: 9 cm Needle insertion depth: 6 cm Catheter size: 20 Guage Catheter at skin depth: 11 cm Test dose: negative and Other  Assessment Events: blood not aspirated, injection not painful, no injection resistance and negative IV test  Additional Notes Informed consent obtained prior to proceeding including risk of failure, 1% risk of PDPH, risk of minor discomfort and bruising.  Discussed rare but serious complications including epidural abscess, permanent nerve injury, epidural hematoma.  Discussed alternatives to epidural analgesia and patient desires to proceed.  Timeout performed pre-procedure verifying patient name, procedure, and platelet count.  Patient tolerated procedure well.

## 2022-01-21 ENCOUNTER — Encounter (HOSPITAL_COMMUNITY)
Admission: AD | Disposition: A | Discharge status: Home / Self Care | Payer: Self-pay | Source: Home / Self Care | Attending: Obstetrics and Gynecology

## 2022-01-21 ENCOUNTER — Other Ambulatory Visit: Payer: Self-pay

## 2022-01-21 ENCOUNTER — Encounter (HOSPITAL_COMMUNITY): Payer: Self-pay | Admitting: Obstetrics and Gynecology

## 2022-01-21 DIAGNOSIS — Z3A40 40 weeks gestation of pregnancy: Secondary | ICD-10-CM | POA: Diagnosis not present

## 2022-01-21 DIAGNOSIS — O3663X1 Maternal care for excessive fetal growth, third trimester, fetus 1: Secondary | ICD-10-CM | POA: Diagnosis not present

## 2022-01-21 DIAGNOSIS — O99344 Other mental disorders complicating childbirth: Secondary | ICD-10-CM

## 2022-01-21 DIAGNOSIS — F418 Other specified anxiety disorders: Secondary | ICD-10-CM

## 2022-01-21 LAB — CBC
HCT: 30.6 % — ABNORMAL LOW (ref 36.0–46.0)
Hemoglobin: 10.5 g/dL — ABNORMAL LOW (ref 12.0–15.0)
MCH: 30.7 pg (ref 26.0–34.0)
MCHC: 34.3 g/dL (ref 30.0–36.0)
MCV: 89.5 fL (ref 80.0–100.0)
Platelets: 326 10*3/uL (ref 150–400)
RBC: 3.42 MIL/uL — ABNORMAL LOW (ref 3.87–5.11)
RDW: 12.6 % (ref 11.5–15.5)
WBC: 24 10*3/uL — ABNORMAL HIGH (ref 4.0–10.5)
nRBC: 0 % (ref 0.0–0.2)

## 2022-01-21 SURGERY — Surgical Case
Anesthesia: Epidural

## 2022-01-21 MED ORDER — PRENATAL MULTIVITAMIN CH
1.0000 | ORAL_TABLET | Freq: Every day | ORAL | Status: DC
  Administered 2022-01-22 – 2022-01-23 (×2): 1 via ORAL
  Filled 2022-01-21 (×2): qty 1

## 2022-01-21 MED ORDER — SIMETHICONE 80 MG PO CHEW
80.0000 mg | CHEWABLE_TABLET | Freq: Three times a day (TID) | ORAL | Status: DC
  Administered 2022-01-21 – 2022-01-23 (×5): 80 mg via ORAL
  Filled 2022-01-21 (×5): qty 1

## 2022-01-21 MED ORDER — NALOXONE HCL 4 MG/10ML IJ SOLN: 1.0000 ug/kg/h | INTRAVENOUS | Status: DC | PRN

## 2022-01-21 MED ORDER — SODIUM CHLORIDE 0.9% FLUSH: 3.0000 mL | INTRAVENOUS | Status: DC | PRN

## 2022-01-21 MED ORDER — KETOROLAC TROMETHAMINE 30 MG/ML IJ SOLN
30.0000 mg | Freq: Four times a day (QID) | INTRAMUSCULAR | Status: AC | PRN
  Administered 2022-01-21: 30 mg via INTRAVENOUS

## 2022-01-21 MED ORDER — MEASLES, MUMPS & RUBELLA VAC IJ SOLR
0.5000 mL | Freq: Once | INTRAMUSCULAR | Status: AC
  Administered 2022-01-23: 0.5 mL via SUBCUTANEOUS
  Filled 2022-01-21 (×2): qty 0.5

## 2022-01-21 MED ORDER — WITCH HAZEL-GLYCERIN EX PADS: 1.0000 | MEDICATED_PAD | CUTANEOUS | Status: DC | PRN

## 2022-01-21 MED ORDER — LIDOCAINE-EPINEPHRINE (PF) 2 %-1:200000 IJ SOLN
INTRAMUSCULAR | Status: DC | PRN
  Administered 2022-01-21: 5 mL via EPIDURAL
  Administered 2022-01-21: 10 mL via EPIDURAL

## 2022-01-21 MED ORDER — PHENYLEPHRINE HCL (PRESSORS) 10 MG/ML IV SOLN
INTRAVENOUS | Status: DC | PRN
  Administered 2022-01-21 (×2): 160 ug via INTRAVENOUS

## 2022-01-21 MED ORDER — CEFAZOLIN SODIUM-DEXTROSE 1-4 GM/50ML-% IV SOLN
INTRAVENOUS | Status: DC | PRN
  Administered 2022-01-21: 2 g via INTRAVENOUS

## 2022-01-21 MED ORDER — FENTANYL CITRATE (PF) 100 MCG/2ML IJ SOLN: 25.0000 ug | INTRAMUSCULAR | Status: DC | PRN

## 2022-01-21 MED ORDER — ACETAMINOPHEN 500 MG PO TABS: 1000.0000 mg | ORAL_TABLET | Freq: Four times a day (QID) | ORAL | Status: DC

## 2022-01-21 MED ORDER — NALOXONE HCL 0.4 MG/ML IJ SOLN: 0.4000 mg | INTRAMUSCULAR | Status: DC | PRN

## 2022-01-21 MED ORDER — SODIUM CHLORIDE 0.9 % IV SOLN
12.5000 mg | Freq: Four times a day (QID) | INTRAVENOUS | Status: DC | PRN
  Administered 2022-01-21: 12.5 mg via INTRAVENOUS
  Filled 2022-01-21: qty 0.5

## 2022-01-21 MED ORDER — SIMETHICONE 80 MG PO CHEW: 80.0000 mg | CHEWABLE_TABLET | ORAL | Status: DC | PRN

## 2022-01-21 MED ORDER — SCOPOLAMINE 1 MG/3DAYS TD PT72
1.0000 | MEDICATED_PATCH | Freq: Once | TRANSDERMAL | Status: DC
  Administered 2022-01-21: 1.5 mg via TRANSDERMAL

## 2022-01-21 MED ORDER — DEXAMETHASONE SODIUM PHOSPHATE 10 MG/ML IJ SOLN
INTRAMUSCULAR | Status: AC
  Filled 2022-01-21: qty 1

## 2022-01-21 MED ORDER — FENTANYL CITRATE (PF) 100 MCG/2ML IJ SOLN
INTRAMUSCULAR | Status: AC
  Filled 2022-01-21: qty 2

## 2022-01-21 MED ORDER — DIBUCAINE (PERIANAL) 1 % EX OINT: 1.0000 | TOPICAL_OINTMENT | CUTANEOUS | Status: DC | PRN

## 2022-01-21 MED ORDER — KETOROLAC TROMETHAMINE 30 MG/ML IJ SOLN
30.0000 mg | Freq: Four times a day (QID) | INTRAMUSCULAR | Status: AC
  Administered 2022-01-21 – 2022-01-22 (×3): 30 mg via INTRAVENOUS
  Filled 2022-01-21 (×3): qty 1

## 2022-01-21 MED ORDER — ONDANSETRON HCL 4 MG/2ML IJ SOLN
INTRAMUSCULAR | Status: DC | PRN
  Administered 2022-01-21: 4 mg via INTRAVENOUS

## 2022-01-21 MED ORDER — SCOPOLAMINE 1 MG/3DAYS TD PT72
MEDICATED_PATCH | TRANSDERMAL | Status: AC
  Filled 2022-01-21: qty 1

## 2022-01-21 MED ORDER — OXYTOCIN-SODIUM CHLORIDE 30-0.9 UT/500ML-% IV SOLN
INTRAVENOUS | Status: DC | PRN
  Administered 2022-01-21: 30 [IU] via INTRAVENOUS

## 2022-01-21 MED ORDER — ONDANSETRON HCL 4 MG/2ML IJ SOLN
INTRAMUSCULAR | Status: AC
  Filled 2022-01-21: qty 2

## 2022-01-21 MED ORDER — SENNOSIDES-DOCUSATE SODIUM 8.6-50 MG PO TABS
2.0000 | ORAL_TABLET | Freq: Every day | ORAL | Status: DC
  Administered 2022-01-22 – 2022-01-23 (×2): 2 via ORAL
  Filled 2022-01-21 (×2): qty 2

## 2022-01-21 MED ORDER — KETOROLAC TROMETHAMINE 30 MG/ML IJ SOLN
INTRAMUSCULAR | Status: AC
  Filled 2022-01-21: qty 1

## 2022-01-21 MED ORDER — OXYTOCIN-SODIUM CHLORIDE 30-0.9 UT/500ML-% IV SOLN: 2.5000 [IU]/h | INTRAVENOUS | Status: AC

## 2022-01-21 MED ORDER — KETOROLAC TROMETHAMINE 30 MG/ML IJ SOLN: 30.0000 mg | Freq: Four times a day (QID) | INTRAMUSCULAR | Status: AC | PRN

## 2022-01-21 MED ORDER — ACETAMINOPHEN 500 MG PO TABS
1000.0000 mg | ORAL_TABLET | Freq: Four times a day (QID) | ORAL | Status: DC
  Administered 2022-01-21 – 2022-01-23 (×8): 1000 mg via ORAL
  Filled 2022-01-21 (×8): qty 2

## 2022-01-21 MED ORDER — COCONUT OIL OIL: 1.0000 | TOPICAL_OIL | Status: DC | PRN

## 2022-01-21 MED ORDER — ZOLPIDEM TARTRATE 5 MG PO TABS: 5.0000 mg | ORAL_TABLET | Freq: Every evening | ORAL | Status: DC | PRN

## 2022-01-21 MED ORDER — DIPHENHYDRAMINE HCL 25 MG PO CAPS: 25.0000 mg | ORAL_CAPSULE | ORAL | Status: DC | PRN

## 2022-01-21 MED ORDER — MORPHINE SULFATE (PF) 0.5 MG/ML IJ SOLN
INTRAMUSCULAR | Status: DC | PRN
  Administered 2022-01-21: 3 mg via EPIDURAL

## 2022-01-21 MED ORDER — SODIUM CHLORIDE 0.9 % IR SOLN
Status: DC | PRN
  Administered 2022-01-21: 1

## 2022-01-21 MED ORDER — IBUPROFEN 600 MG PO TABS
600.0000 mg | ORAL_TABLET | Freq: Four times a day (QID) | ORAL | Status: DC
  Administered 2022-01-22 – 2022-01-23 (×4): 600 mg via ORAL
  Filled 2022-01-21 (×5): qty 1

## 2022-01-21 MED ORDER — OXYTOCIN-SODIUM CHLORIDE 30-0.9 UT/500ML-% IV SOLN
INTRAVENOUS | Status: AC
  Filled 2022-01-21: qty 500

## 2022-01-21 MED ORDER — DEXAMETHASONE SODIUM PHOSPHATE 10 MG/ML IJ SOLN
INTRAMUSCULAR | Status: DC | PRN
  Administered 2022-01-21: 10 mg via INTRAVENOUS

## 2022-01-21 MED ORDER — OXYTOCIN-SODIUM CHLORIDE 30-0.9 UT/500ML-% IV SOLN: 1.0000 m[IU]/min | INTRAVENOUS | Status: DC

## 2022-01-21 MED ORDER — OXYCODONE HCL 5 MG PO TABS
5.0000 mg | ORAL_TABLET | ORAL | Status: DC | PRN
  Administered 2022-01-22: 5 mg via ORAL
  Filled 2022-01-21 (×2): qty 1

## 2022-01-21 MED ORDER — ACETAMINOPHEN 10 MG/ML IV SOLN: 1000.0000 mg | Freq: Once | INTRAVENOUS | Status: DC | PRN

## 2022-01-21 MED ORDER — FENTANYL CITRATE (PF) 100 MCG/2ML IJ SOLN
INTRAMUSCULAR | Status: DC | PRN
  Administered 2022-01-21: 100 ug via EPIDURAL

## 2022-01-21 MED ORDER — MENTHOL 3 MG MT LOZG: 1.0000 | LOZENGE | OROMUCOSAL | Status: DC | PRN

## 2022-01-21 MED ORDER — MORPHINE SULFATE (PF) 0.5 MG/ML IJ SOLN
INTRAMUSCULAR | Status: AC
  Filled 2022-01-21: qty 10

## 2022-01-21 MED ORDER — DIPHENHYDRAMINE HCL 50 MG/ML IJ SOLN: 12.5000 mg | INTRAMUSCULAR | Status: DC | PRN

## 2022-01-21 MED ORDER — DIPHENHYDRAMINE HCL 25 MG PO CAPS: 25.0000 mg | ORAL_CAPSULE | Freq: Four times a day (QID) | ORAL | Status: DC | PRN

## 2022-01-21 MED ORDER — TETANUS-DIPHTH-ACELL PERTUSSIS 5-2.5-18.5 LF-MCG/0.5 IM SUSY: 0.5000 mL | PREFILLED_SYRINGE | Freq: Once | INTRAMUSCULAR | Status: DC

## 2022-01-21 MED ORDER — ONDANSETRON HCL 4 MG/2ML IJ SOLN
4.0000 mg | Freq: Three times a day (TID) | INTRAMUSCULAR | Status: DC | PRN
  Administered 2022-01-21: 4 mg via INTRAVENOUS
  Filled 2022-01-21: qty 2

## 2022-01-21 SURGICAL SUPPLY — 35 items
APL SKNCLS STERI-STRIP NONHPOA (GAUZE/BANDAGES/DRESSINGS) ×1
BENZOIN TINCTURE PRP APPL 2/3 (GAUZE/BANDAGES/DRESSINGS) ×1 IMPLANT
CHLORAPREP W/TINT 26ML (MISCELLANEOUS) ×2 IMPLANT
CLAMP UMBILICAL CORD (MISCELLANEOUS) ×1 IMPLANT
CLOTH BEACON ORANGE TIMEOUT ST (SAFETY) ×1 IMPLANT
DRSG OPSITE POSTOP 4X10 (GAUZE/BANDAGES/DRESSINGS) ×1 IMPLANT
ELECT REM PT RETURN 9FT ADLT (ELECTROSURGICAL) ×1
ELECTRODE REM PT RTRN 9FT ADLT (ELECTROSURGICAL) ×1 IMPLANT
EXTRACTOR VACUUM M CUP 4 TUBE (SUCTIONS) IMPLANT
GLOVE BIO SURGEON STRL SZ7.5 (GLOVE) ×1 IMPLANT
GLOVE BIOGEL PI IND STRL 7.0 (GLOVE) ×1 IMPLANT
GLOVE BIOGEL PI IND STRL 7.5 (GLOVE) ×1 IMPLANT
GOWN STRL REUS W/TWL LRG LVL3 (GOWN DISPOSABLE) ×2 IMPLANT
KIT ABG SYR 3ML LUER SLIP (SYRINGE) IMPLANT
NDL HYPO 25X5/8 SAFETYGLIDE (NEEDLE) IMPLANT
NEEDLE HYPO 25X5/8 SAFETYGLIDE (NEEDLE) IMPLANT
NS IRRIG 1000ML POUR BTL (IV SOLUTION) ×1 IMPLANT
PACK C SECTION WH (CUSTOM PROCEDURE TRAY) ×1 IMPLANT
PAD OB MATERNITY 4.3X12.25 (PERSONAL CARE ITEMS) ×1 IMPLANT
RTRCTR C-SECT PINK 25CM LRG (MISCELLANEOUS) ×1 IMPLANT
STRIP CLOSURE SKIN 1/2X4 (GAUZE/BANDAGES/DRESSINGS) ×1 IMPLANT
SUT CHROMIC 2 0 CT 1 (SUTURE) ×1 IMPLANT
SUT MNCRL 0 VIOLET CTX 36 (SUTURE) ×1 IMPLANT
SUT MNCRL AB 3-0 PS2 27 (SUTURE) ×1 IMPLANT
SUT MON AB-0 CT1 36 (SUTURE) IMPLANT
SUT MONOCRYL 0 CTX 36 (SUTURE) ×1
SUT PLAIN 2 0 XLH (SUTURE) ×1 IMPLANT
SUT VIC AB 0 CT1 36 (SUTURE) ×1 IMPLANT
SUT VIC AB 0 CTX 36 (SUTURE) ×3
SUT VIC AB 0 CTX36XBRD ANBCTRL (SUTURE) ×3 IMPLANT
SUT VIC AB 2-0 SH 27 (SUTURE)
SUT VIC AB 2-0 SH 27XBRD (SUTURE) IMPLANT
TOWEL OR 17X24 6PK STRL BLUE (TOWEL DISPOSABLE) ×1 IMPLANT
TRAY FOLEY W/BAG SLVR 14FR LF (SET/KITS/TRAYS/PACK) ×1 IMPLANT
WATER STERILE IRR 1000ML POUR (IV SOLUTION) ×1 IMPLANT

## 2022-01-21 NOTE — Anesthesia Postprocedure Evaluation (Signed)
Anesthesia Post Note  Patient: VERNECIA UMBLE  Procedure(s) Performed: CESAREAN SECTION     Patient location during evaluation: PACU Anesthesia Type: Epidural Level of consciousness: oriented and awake and alert Pain management: pain level controlled Vital Signs Assessment: post-procedure vital signs reviewed and stable Respiratory status: spontaneous breathing, respiratory function stable and patient connected to nasal cannula oxygen Cardiovascular status: blood pressure returned to baseline and stable Postop Assessment: no headache, no backache, no apparent nausea or vomiting and epidural receding Anesthetic complications: no   No notable events documented.  Last Vitals:  Vitals:   01/21/22 1344 01/21/22 1740  BP: (!) 100/55 (!) 95/53  Pulse:  80  Resp: 16 17  Temp: 36.7 C 37 C  SpO2: 94% 95%    Last Pain:  Vitals:   01/21/22 1805  TempSrc:   PainSc: 1    Pain Goal:                   Aava Deland L Temeka Pore

## 2022-01-21 NOTE — Transfer of Care (Signed)
Immediate Anesthesia Transfer of Care Note  Patient: Julie Warner  Procedure(s) Performed: CESAREAN SECTION  Patient Location: PACU  Anesthesia Type:Epidural  Level of Consciousness: awake, alert  and oriented  Airway & Oxygen Therapy: Patient Spontanous Breathing  Post-op Assessment: Report given to RN and Post -op Vital signs reviewed and stable  Post vital signs: Reviewed and stable  Last Vitals:  Vitals Value Taken Time  BP 111/61 01/21/22 0505  Temp    Pulse 104 01/21/22 0511  Resp 17 01/21/22 0511  SpO2 93 % 01/21/22 0511  Vitals shown include unvalidated device data.  Last Pain:  Vitals:   01/21/22 0250  TempSrc: Axillary  PainSc:          Complications: No notable events documented.

## 2022-01-21 NOTE — Op Note (Addendum)
Cesarean Section Procedure Note  Indications: P0 at 40 5/7 wks with protracted labor course suspected LGA and fetal intolerance with adequate contractions.  Pre-operative Diagnosis: 1.40 5/7wks 2.Protracted labor 3.Suspect LGA 4.Fetal Intolerance   Post-operative Diagnosis: 1.40 5/7wks 2.Protracted labor 3.Suspect LGA 4.Fetal Intolerance  Procedure: Primary Low Transverse Cesarean Section  Surgeon: Everett Graff, MD    Assistants: Dr. Liliane Channel, OB Fellow  Anesthesia: Regional  Anesthesiologist: Freddrick March, MD   Procedure Details  The patient was taken to the operating room secondary to protracted labor and the above after the risks, benefits, complications, treatment options, and expected outcomes were discussed with the patient.  The patient concurred with the proposed plan, giving informed consent which was signed and witnessed. The patient was taken to Operating Room B, identified as ENAS WINCHEL and the procedure verified as C-Section Delivery. A Time Out was held and the above information confirmed.  After induction of anesthesia by obtaining a spinal, the patient was prepped and draped in the usual sterile manner. A Pfannenstiel skin incision was made and carried down through the subcutaneous tissue to the underlying layer of fascia.  The fascia was incised bilaterally and extended transversely bilaterally with the Mayo scissors. Kocher clamps were placed on the inferior aspect of the fascial incision and the underlying rectus muscle was separated from the fascia. The same was done on the superior aspect of the fascial incision.  The peritoneum was identified, entered bluntly and extended manually.  An Alexis self-retaining retractor was placed.  The utero-vesical peritoneal reflection was incised transversely and the bladder flap was bluntly freed from the lower uterine segment. A low transverse uterine incision was made with the scalpel and extended bilaterally with  the bandage scissors.  The infant was delivered in vertex position (LOT) without difficulty.  After the umbilical cord was clamped and cut, the infant was handed to the awaiting pediatricians.  Cord blood was obtained for evaluation.  The placenta was removed intact and appeared to be within normal limits. The uterus was cleared of all clots and debris. The uterine incision was closed with running interlocking sutures of 0 Vicryl and a second imbricating layer was performed as well.   Bilateral tubes and ovaries appeared to be within normal limits.  Good hemostasis was noted.  Copious irrigation was performed until clear.  The peritoneum was repaired with 2-0 chromic via a running suture.  The fascia was reapproximated with a running suture of 0 Vicryl. The subcutaneous tissue was reapproximated with 3 interrupted sutures of 2-0 plain.  The skin was reapproximated with a subcuticular suture of 3-0 monocryl.  Steristrips were applied.  Instrument, sponge, and needle counts were correct prior to abdominal closure and at the conclusion of the case.  The patient was awaiting transfer to the recovery room in good condition.  Findings: Live female infant with Apgars 8 at one minute and 9 at five minutes.  Normal appearing bilateral ovaries and fallopian tubes were noted.  Estimated Blood Loss:  1079 ml         Drains: foley to gravity 100 cc clear urine         Total IV Fluids: 1500 ml         Specimens to Pathology: None         Complications:  None; patient tolerated the procedure well.         Disposition: PACU - hemodynamically stable.         Condition: stable  Attending  Attestation: I performed the procedure.  I was present and scrubbed and the assistant was required due to complexity of anatomy.  An experienced assistant was required given the standard of surgical care given the complexity of the case.  This assistant was needed for exposure, dissection, suctioning, retraction, instrument  exchange, assisting with delivery with administration of fundal pressure, and for overall help during the procedure.

## 2022-01-21 NOTE — Lactation Note (Signed)
This note was copied from a baby's chart. Lactation Consultation Note  Patient Name: Julie Warner ZTAEW'Y Date: 01/21/2022 Reason for consult: Initial assessment;1st time breastfeeding Age:26 hours  P1, Baby has been fussy. Latched briefly after birth. Reviewed hand expression.  Baby spoon fed colostrum 4 ml. Had mother prepump with manual pump.  21 flanges will be given since they will be a better fit. Baby latched briefly but did not sustain. Feed on demand with cues.  Goal 8-12+ times per day after first 24 hrs.  Place baby STS if not cueing.  Encouraged family to spoon feed if baby does not latch.   Maternal Data Has patient been taught Hand Expression?: Yes Does the patient have breastfeeding experience prior to this delivery?: No  Feeding Mother's Current Feeding Choice: Breast Milk  LATCH Score Latch: Repeated attempts needed to sustain latch, nipple held in mouth throughout feeding, stimulation needed to elicit sucking reflex.  Audible Swallowing: None  Type of Nipple: Everted at rest and after stimulation  Comfort (Breast/Nipple): Soft / non-tender  Hold (Positioning): Assistance needed to correctly position infant at breast and maintain latch.  LATCH Score: 6   Lactation Tools Discussed/Used Tools: Pump;Flanges Flange Size: 21 Breast pump type: Manual Reason for Pumping: stimulaton  Interventions Interventions: Assisted with latch;Skin to skin;Hand express;Pre-pump if needed;Adjust position;Support pillows;Position options;Expressed milk;Hand pump;Education;LC Services brochure  Discharge Pump:  (Advised to call [facility])  Consult Status Consult Status: Follow-up Date: 01/22/22 Follow-up type: In-patient    Vivianne Master [facility] 01/21/2022, 11:05 AM

## 2022-01-21 NOTE — Progress Notes (Addendum)
Postpartum Note Day #0  S:  Patient doing well.  Pain controlled. Has been nauseated and vomited after Zofran administration. Has not really eaten anything yet. Has not ambulated or voided -- foley still in place. Denies fevers, chills, chest pain, SOB, N/V, or worsening bilateral LE edema.  Lochia: Minimal Infant feeding:  Breast Circumcision:  Does not desire Contraception:  None  O: Temp:  [97.9 F (36.6 C)-100.3 F (37.9 C)] 98.8 F (37.1 C) (09/19 0648) Pulse Rate:  [72-130] 102 (09/19 0648) Resp:  [0-25] 18 (09/19 0648) BP: (83-123)/(46-87) 110/65 (09/19 0648) SpO2:  [92 %-100 %] 95 % (09/19 0654) Gen: NAD, pleasant and cooperative Resp: No increased work of breathing Abdomen: soft, non-distended, non-tender throughout Uterus: firm, non-tender, below umbilicus Incision: c/d/i, bandage in place  Ext: Trace bilateral LE edema, no bilateral calf tenderness  UOP: 100-300cc/hour  Labs:  Recent Labs    01/19/22 2315  HGB 11.7*  HCT 35.8*    A/P: Patient is a 26 y.o. [id number] POD#0 s/p LTCS.  - Pain well controlled  - GU: UOP is adequate - GI: Tolerating regular diet, Phenergan 12.'5mg'$  IV ordered in addition to Zofran for nausea - Activity: encouraged sitting up to chair and ambulation as tolerated - DVT Prophylaxis: Ambulation, SCDs - Labs: CBC pending  Disposition:  D/C home POD#2-3.  Drema Dallas, DO [phone] (office)

## 2022-01-21 NOTE — Progress Notes (Signed)
AL/0 Cat 2 tracing Protracted labor course with fetal intolerance as contractions reach adequacy and ?LGA with CPD I discussed with the patient options and she would like to proceed with cesarean section Risks benefits alternatives reviewed with the patient including but not limited to bleeding infection and injury Questions answered and consent signed and witnessed

## 2022-01-22 LAB — CBC
HCT: 22.6 % — ABNORMAL LOW (ref 36.0–46.0)
Hemoglobin: 7.5 g/dL — ABNORMAL LOW (ref 12.0–15.0)
MCH: 30.6 pg (ref 26.0–34.0)
MCHC: 33.2 g/dL (ref 30.0–36.0)
MCV: 92.2 fL (ref 80.0–100.0)
Platelets: 226 10*3/uL (ref 150–400)
RBC: 2.45 MIL/uL — ABNORMAL LOW (ref 3.87–5.11)
RDW: 12.7 % (ref 11.5–15.5)
WBC: 15.5 10*3/uL — ABNORMAL HIGH (ref 4.0–10.5)
nRBC: 0 % (ref 0.0–0.2)

## 2022-01-22 MED ORDER — HYDROCHLOROTHIAZIDE 12.5 MG PO TABS
12.5000 mg | ORAL_TABLET | Freq: Every day | ORAL | Status: DC
  Administered 2022-01-22 – 2022-01-23 (×2): 12.5 mg via ORAL
  Filled 2022-01-22 (×2): qty 1

## 2022-01-22 MED ORDER — SODIUM CHLORIDE 0.9 % IV SOLN
500.0000 mg | Freq: Once | INTRAVENOUS | Status: AC
  Administered 2022-01-22: 500 mg via INTRAVENOUS
  Filled 2022-01-22: qty 500

## 2022-01-22 MED ORDER — AMMONIA AROMATIC IN INHA
RESPIRATORY_TRACT | Status: AC
  Filled 2022-01-22: qty 10

## 2022-01-22 NOTE — Lactation Note (Signed)
This note was copied from a baby's chart. Lactation Consultation Note  Patient Name: Julie Warner RDEYC'X Date: 01/22/2022 Reason for consult: Follow-up assessment;Term;Primapara Age:26 hours  I returned to patient's room to assess pumping, but Mom was sleeping. Dad's questions were answered.   Maternal Data Does the patient have breastfeeding experience prior to this delivery?: No  Feeding Mother's Current Feeding Choice: Breast Milk and Formula Nipple Type: Nfant Slow Flow (purple)   Lactation Tools Discussed/Used Flange Size: 21 Breast pump type: Double-Electric Breast Pump Pumping frequency: last pumped around 1500/1600 Pumped volume: 0 mL  Interventions Interventions: Education;Pace feeding  Discharge    Consult Status Consult Status: Follow-up Date: 01/22/22 Follow-up type: In-patient    Matthias Hughs [facility] 01/22/2022, 8:24 PM

## 2022-01-22 NOTE — Progress Notes (Signed)
MOB was referred for history of depression/anxiety. * Referral screened out by Clinical Social Worker because none of the following criteria appear to apply: ~ History of anxiety/depression during this pregnancy, or of post-partum depression following prior delivery. ~ Diagnosis of anxiety and/or depression within last 3 years OR * MOB's symptoms currently being treated with medication and/or therapy. Please contact the Clinical Social Worker if needs arise, by MOB request, or if MOB scores greater than 9/yes to question 10 on Edinburgh Postpartum Depression Screen.  Irja Wheless Boyd-Gilyard, MSW, LCSW Clinical Social Work [phone]  

## 2022-01-22 NOTE — Progress Notes (Signed)
Postpartum Note Day #1  S:  Patient doing well.  Pain controlled. Has tolerated regular diet. Patient reports two I&O catheters overnight. Awaiting void. Feels exhausted as she has barely slept overnight. Denies fevers, chills, chest pain, SOB, N/V, or worsening bilateral LE edema.  Lochia: Minimal Infant feeding:  Breast Circumcision:  Does not desire Contraception:  None  O: Temp:  [97.7 F (36.5 C)-98.6 F (37 C)] 98.1 F (36.7 C) (09/20 0753) Pulse Rate:  [76-94] 80 (09/20 0753) Resp:  [15-18] 18 (09/20 0753) BP: (92-117)/(43-57) 97/54 (09/20 0753) SpO2:  [94 %-100 %] 99 % (09/20 0442) Gen: NAD, pleasant and cooperative, very tired-appearing Resp: No increased work of breathing Abdomen: soft, non-distended, non-tender throughout Uterus: firm, non-tender, below umbilicus Incision: c/d/i, bandage in place  Ext: Trace bilateral LE edema, no bilateral calf tenderness   Labs:  Recent Labs    01/21/22 0950 01/22/22 0708  HGB 10.5* 7.5*  HCT 30.6* 22.6*    A/P: Patient is a 26 y.o. [id number] POD#1 s/p LTCS.  - Pain well controlled  - GU: Per patient, required two in and out catheter overnight, awaiting void postpartum - GI: Tolerating regular diet - Activity: encouraged sitting up to chair and ambulation as tolerated, encouraged rest/sleep today - DVT Prophylaxis: Ambulation, SCDs - Labs: acute blood loss anemia - IV Venofer ordered  Disposition:  D/C home POD#3.  Drema Dallas, DO [phone] (office)

## 2022-01-22 NOTE — Lactation Note (Signed)
This note was copied from a baby's chart. Lactation Consultation Note  Patient Name: Julie Warner QAESL'P Date: 01/22/2022 Reason for consult: Follow-up assessment;Term;Primapara Age:26 hours  Birthing parent & aunt report that the Similac slow-flow nipple was too fast. The Enfamil Extra-Slow flow was attempted, but it required too much pacing. The Nfant Slow flow nipple was tried with good results & requiring less pacing. Message was left with SLP.  Mom reports that pumping was uncomfortable the last time she did it. Mom would like for me to return to observe her pump.    Maternal Data Does the patient have breastfeeding experience prior to this delivery?: No  Feeding Mother's Current Feeding Choice: Breast Milk and Formula Nipple Type: Nfant Slow Flow (purple)  LATCH Score Latch: Repeated attempts needed to sustain latch, nipple held in mouth throughout feeding, stimulation needed to elicit sucking reflex.  Audible Swallowing: A few with stimulation  Type of Nipple: Flat  Comfort (Breast/Nipple): Soft / non-tender  Hold (Positioning): Assistance needed to correctly position infant at breast and maintain latch.  LATCH Score: 6   Lactation Tools Discussed/Used Flange Size: 21 Breast pump type: Double-Electric Breast Pump Pumping frequency: last pumped around 1500/1600 Pumped volume: 0 mL  Interventions Interventions: Education;Pace feeding  Discharge    Consult Status Consult Status: Follow-up Date: 01/22/22 Follow-up type: In-patient    Matthias Hughs [facility] 01/22/2022, 6:36 PM

## 2022-01-23 ENCOUNTER — Encounter (HOSPITAL_COMMUNITY): Payer: Self-pay | Admitting: Obstetrics and Gynecology

## 2022-01-23 LAB — BIRTH TISSUE RECOVERY COLLECTION (PLACENTA DONATION)

## 2022-01-23 MED ORDER — OXYCODONE HCL 5 MG PO TABS: 5.0000 mg | ORAL_TABLET | Freq: Four times a day (QID) | ORAL | 0 refills | Status: AC | PRN

## 2022-01-23 MED ORDER — IBUPROFEN 600 MG PO TABS: 600.0000 mg | ORAL_TABLET | Freq: Four times a day (QID) | ORAL | 1 refills | Status: AC | PRN

## 2022-01-23 NOTE — Discharge Summary (Signed)
Postpartum Discharge Summary  Date of Service: 01/23/22     Patient Name: Julie Warner DOB: 1996/04/20 MRN: [id number]  Date of admission: 01/19/2022 Delivery date:01/21/2022  Delivering provider: Everett Graff  Date of discharge: 01/23/2022  Admitting diagnosis: Encounter for induction of labor [Z34.90] Tourette's Syndrome  Rubella Non-immune  Hx migraines Anxiety  Intrauterine pregnancy: [redacted]w[redacted]d    Secondary diagnosis:  Principal Problem:   Encounter for induction of labor Tourette's Syndrome  Rubella Non-immune  Hx migraines Anxiety  Additional problems: None    Discharge diagnosis: Term Pregnancy Delivered                                              Post partum procedures: Iron infusion Augmentation: AROM, Pitocin, and Cytotec Complications: [id number] Hospital course: Induction of Labor With Cesarean Section   26y.o. yo [id number] admitted to the hospital 01/19/2022 for induction of labor. Patient had a labor course significant for protracted labor course. The patient went for cesarean section due to  Protracted labor, fetal intolerance of labor . Delivery details are as follows: Membrane Rupture Time/Date: 1:28 PM ,01/20/2022   Delivery Method:C-Section, Low Transverse  Details of operation can be found in separate operative Note.  Patient had an uncomplicated postpartum course. Due to blood loss of 1079cc, she had a drop in her Hemoglobin from 11.7 to 7.5 and she received IV Venofer x 1 dose postpartum. She also received HCTZ 12.70m31m 1 for bilateral LE edema which improved. She is ambulating, tolerating a regular diet, passing flatus, and urinating well.  Patient is discharged home in stable condition on 01/23/22.      Newborn Data: Birth date:01/21/2022  Birth time:4:00 AM  Gender:Female  Living status:Living  Apgars:8 ,9  Weight:3380 g                                Magnesium Sulfate received: No BMZ received: No Rhophylac:N/A MMRDZH:GDJMEQA be  given prior to discharge T-DaP:Given prenatally Flu: No Transfusion:No  Physical exam  Vitals:   01/22/22 1607 01/22/22 2023 01/23/22 0013 01/23/22 0543  BP: (!) 99/42 (!) 101/48 (!) 113/58 108/65  Pulse: 78 77 88 79  Resp: '18 16 18 16  ' Temp: 98.1 F (36.7 C) 98.2 F (36.8 C) 98.2 F (36.8 C) 98 F (36.7 C)  TempSrc: Oral Oral Oral Oral  SpO2:  99% 99% 99%  Weight:      Height:       General: alert, cooperative, and no distress Cardio:  RRR Lungs: CTAB, no wheezes/rales/rhonchi Lochia: appropriate Uterine Fundus: firm Incision: Dressing is clean, dry, and intact DVT Evaluation: No evidence of DVT seen on physical exam. No significant calf/ankle edema. Labs: Lab Results  Component Value Date   WBC 15.5 (H) 01/22/2022   HGB 7.5 (L) 01/22/2022   HCT 22.6 (L) 01/22/2022   MCV 92.2 01/22/2022   PLT 226 01/22/2022      Latest Ref Rng & Units 06/25/2020    4:14 PM  CMP  Glucose 70 - 99 mg/dL 86   BUN 6 - 20 mg/dL 13   Creatinine 0.44 - 1.00 mg/dL 0.86   Sodium 135 - 145 mmol/L 141   Potassium 3.5 - 5.1 mmol/L 4.4   Chloride 98 - 111 mmol/L 103  CO2 22 - 32 mmol/L 29   Calcium 8.9 - 10.3 mg/dL 10.0   Total Protein 6.5 - 8.1 g/dL 7.3   Total Bilirubin 0.3 - 1.2 mg/dL 0.5   Alkaline Phos 38 - 126 U/L 83   AST 15 - 41 U/L 24   ALT 0 - 44 U/L 16    Edinburgh Score:     No data to display            After visit meds:  Allergies as of 01/23/2022       Reactions   Lexapro [escitalopram Oxalate] Other (See Comments)   stroke        Medication List     STOP taking these medications    ALPRAZolam 0.25 MG tablet Commonly known as: XANAX   ondansetron 8 MG disintegrating tablet Commonly known as: ZOFRAN-ODT   sertraline 100 MG tablet Commonly known as: ZOLOFT       TAKE these medications    Butalbital-APAP-Caffeine 50-300-40 MG Caps Commonly known as: Fioricet Take 1 capsule by mouth every 6 (six) hours as needed.   D3-50 1.25 MG (50000  UT) capsule Generic drug: Cholecalciferol Take 50,000 Units by mouth once a week.   ibuprofen 600 MG tablet Commonly known as: ADVIL Take 1 tablet (600 mg total) by mouth every 6 (six) hours as needed for mild pain, moderate pain or cramping.   oxyCODONE 5 MG immediate release tablet Commonly known as: Oxy IR/ROXICODONE Take 1-2 tablets (5-10 mg total) by mouth every 6 (six) hours as needed for severe pain or breakthrough pain.   prenatal multivitamin Tabs tablet Take 1 tablet by mouth daily at 12 noon.   Vitamin B12 1000 MCG Tbcr         Discharge home in stable condition Infant Feeding: Bottle and Breast Infant Disposition:home with mother Discharge instruction: per After Visit Summary and Postpartum booklet. Activity: Advance as tolerated. Pelvic rest for 6 weeks.  Diet: routine diet Anticipated Birth Control:  None Postpartum Appointment:6 weeks Additional Postpartum F/U: Postpartum Depression checkup and Incision check 2 weeks Future Appointments:No future appointments. Follow up Visit:  Follow-up Information     Drema Dallas, DO Follow up in 2 week(s).   Specialty: Obstetrics and Gynecology Why: Our office will call you with your 2 week follow-up postpartum for an incision check and your 6 week postpartum visit. Contact information: [address] [place] [address] [phone]                     01/23/2022 Drema Dallas, DO

## 2022-01-23 NOTE — Progress Notes (Signed)
   01/23/22 1450  Departure Condition  Departure Condition Good  Mobility at [facility]  Patient/Caregiver Teaching Teach Back Method Used;Discharge instructions reviewed;Prescriptions reviewed;Follow-up care reviewed;Pain management discussed;Medications discussed;Patient/caregiver verbalized understanding  Departure Mode With family  Was procedural sedation performed on this patient during this visit? No   Patient alert and oriented x4, VS and pain stable

## 2022-01-23 NOTE — Lactation Note (Signed)
This note was copied from a baby's chart. Lactation Consultation Note  Patient Name: Julie Warner [id number]'P Date: 01/23/2022 Reason for consult: Follow-up assessment;Primapara;Term Age:26 hours  Mom did not want to put infant to the breast b/c she was concerned that "Julie Warner" wouldn't get enough. I talked to Mom about the importance of providing breast milk, so Mom was willing to have me observe her use the hand pump. Mom was able to express 15 mL. Any discomfort Mom has is likely b/c the size 21 flange is too big for her, but she was able to modulate the suction and was comfortable with the hand pump.  Meanwhile, Dad was feeding infant with the Nfant Slow Flow nipple and more pacing was required due to louder swallows. I tried the Nfant Extra Slow Flow nipple & infant did well (except for gagging/coughing once). I noted that infant's RR was calmer with the NFant Extra Slow Flow nipple and infant was able to take a larger volume (infant has been taking small volumes frequently). A msg was left with SLP.  Parents know how to reach [place] for post-discharge questions.   Maternal Data Has patient been taught Hand Expression?: Yes Does the patient have breastfeeding experience prior to this delivery?: No  Feeding Mother's Current Feeding Choice: Breast Milk and Formula Nipple Type: Nfant Extra Slow Flow (gold)   Lactation Tools Discussed/Used Flange Size: 21 Breast pump type: Manual Pumping frequency: Mom encouraged to pump whenever infant gets a bottle Pumped volume: 15 mL  Interventions Interventions: Education;Pace feeding  Discharge Pump: Manual WIC Program: No  Consult Status Consult Status: Complete    Larkin Ina 01/23/2022, 11:15 AM

## 2022-01-23 NOTE — Progress Notes (Signed)
CSW acknowledged 2nd consult.  Consult screened out due to MOB's MH being greater than 3 years. MOB's score is 7 and that does not warrant a CSW consult.   Please contact CSW if MOB request or if a need arise.   Laurey Arrow, MSW, LCSW Clinical Social Work [phone]

## 2022-01-23 NOTE — Progress Notes (Signed)
Postpartum Note Day #2  S:  Patient doing well.  Pain controlled. Has tolerated regular diet. Ambulating and voiding without issue. Lower extremity edema from overnight improved with one dose of HCTZ. States she can walk more easily now. Strongly desires discharge home if infant is discharged. Has had lactation consultation but did give baby formula which has improved his eating. Denies fevers, chills, chest pain, SOB, N/V, or worsening bilateral LE edema.  Lochia: Minimal Infant feeding:  Breast Circumcision:  Does not desire Contraception:  None  O: Temp:  [98 F (36.7 C)-98.2 F (36.8 C)] 98 F (36.7 C) (09/21 0543) Pulse Rate:  [77-88] 79 (09/21 0543) Resp:  [16-18] 16 (09/21 0543) BP: (97-113)/(42-65) 108/65 (09/21 0543) SpO2:  [99 %] 99 % (09/21 0543) Gen: NAD, pleasant and cooperative Cardio: RRR Resp: CTAB, no wheezes/rales/rhonchi Abdomen: soft, non-distended, non-tender throughout Uterus: firm, non-tender, below umbilicus Incision: c/d/i, bandage in place  Ext: 2+ bilateral LE edema, no bilateral calf tenderness   Labs:  Recent Labs    01/21/22 0950 01/22/22 0708  HGB 10.5* 7.5*  HCT 30.6* 22.6*    A/P: Patient is a 26 y.o. [id number] POD#2 s/p LTCS.  - Pain well controlled  - GU: Voiding spontaneously - GI: Tolerating regular diet - Activity: encouraged sitting up to chair and ambulation  - DVT Prophylaxis: Ambulation, SCDs - Labs: acute blood loss anemia - s/p IV Venofer yesterday - HCTZ 12.'5mg'$  x 1 dose given for LE edema which has improved this morning  Disposition:  D/C home today if infant is discharged; otherwise, D/C home tomorrow  Drema [place], [place] (office)

## 2022-01-23 NOTE — Plan of Care (Signed)
Problem: Education: Goal: Knowledge of Childbirth will improve Outcome: Adequate for Discharge Goal: Ability to make informed decisions regarding treatment and plan of care will improve Outcome: Adequate for Discharge Goal: Ability to state and carry out methods to decrease the pain will improve Outcome: Adequate for Discharge Goal: Individualized Educational Video(s) Outcome: Adequate for Discharge   Problem: Coping: Goal: Ability to verbalize concerns and feelings about labor and delivery will improve Outcome: Adequate for Discharge   Problem: Life Cycle: Goal: Ability to make normal progression through stages of labor will improve Outcome: Adequate for Discharge Goal: Ability to effectively push during vaginal delivery will improve Outcome: Adequate for Discharge   Problem: Role Relationship: Goal: Will demonstrate positive interactions with the child Outcome: Adequate for Discharge   Problem: Safety: Goal: Risk of complications during labor and delivery will decrease Outcome: Adequate for Discharge   Problem: Pain Management: Goal: Relief or control of pain from uterine contractions will improve Outcome: Adequate for Discharge   Problem: Education: Goal: Knowledge of General Education information will improve Description: Including pain rating scale, medication(s)/side effects and non-pharmacologic comfort measures Outcome: Adequate for Discharge   Problem: Health Behavior/Discharge Planning: Goal: Ability to manage health-related needs will improve Outcome: Adequate for Discharge   Problem: Clinical Measurements: Goal: Ability to maintain clinical measurements within normal limits will improve Outcome: Adequate for Discharge Goal: Will remain free from infection Outcome: Adequate for Discharge Goal: Diagnostic test results will improve Outcome: Adequate for Discharge Goal: Respiratory complications will improve Outcome: Adequate for Discharge Goal:  Cardiovascular complication will be avoided Outcome: Adequate for Discharge   Problem: Activity: Goal: Risk for activity intolerance will decrease Outcome: Adequate for Discharge   Problem: Nutrition: Goal: Adequate nutrition will be maintained Outcome: Adequate for Discharge   Problem: Coping: Goal: Level of anxiety will decrease Outcome: Adequate for Discharge   Problem: Elimination: Goal: Will not experience complications related to bowel motility Outcome: Adequate for Discharge Goal: Will not experience complications related to urinary retention Outcome: Adequate for Discharge   Problem: Pain Managment: Goal: General experience of comfort will improve Outcome: Adequate for Discharge   Problem: Safety: Goal: Ability to remain free from injury will improve Outcome: Adequate for Discharge   Problem: Skin Integrity: Goal: Risk for impaired skin integrity will decrease Outcome: Adequate for Discharge   Problem: Education: Goal: Knowledge of the prescribed therapeutic regimen will improve Outcome: Adequate for Discharge Goal: Understanding of sexual limitations or changes related to disease process or condition will improve Outcome: Adequate for Discharge Goal: Individualized Educational Video(s) Outcome: Adequate for Discharge   Problem: Self-Concept: Goal: Communication of feelings regarding changes in body function or appearance will improve Outcome: Adequate for Discharge   Problem: Skin Integrity: Goal: Demonstration of wound healing without infection will improve Outcome: Adequate for Discharge   Problem: Education: Goal: Knowledge of condition will improve Outcome: Adequate for Discharge Goal: Individualized Educational Video(s) Outcome: Adequate for Discharge Goal: Individualized Newborn Educational Video(s) Outcome: Adequate for Discharge   Problem: Activity: Goal: Will verbalize the importance of balancing activity with adequate rest periods Outcome:  Adequate for Discharge Goal: Ability to tolerate increased activity will improve Outcome: Adequate for Discharge   Problem: Coping: Goal: Ability to identify and utilize available resources and services will improve Outcome: Adequate for Discharge   Problem: Life Cycle: Goal: Chance of risk for complications during the postpartum period will decrease Outcome: Adequate for Discharge   Problem: Role Relationship: Goal: Ability to demonstrate positive interaction with newborn will improve Outcome: Adequate  for Discharge   Problem: Skin Integrity: Goal: Demonstration of wound healing without infection will improve Outcome: Adequate for Discharge

## 2022-01-29 ENCOUNTER — Telehealth (HOSPITAL_COMMUNITY): Payer: Self-pay

## 2022-01-29 NOTE — Telephone Encounter (Signed)
Patient reports feeling pretty good. Patient declines questions/concerns about her health and healing.  Patient reports that baby is doing really good. Eating, peeing/pooping, and gaining weight well. Baby sleeps in a bedside bassinet. RN reviewed ABC's of safe sleep with patient. Patient declines any questions or concerns about baby.  EPDS score is 3.  Sharyn Lull [facility]  01/29/22,1755

## 2024-02-03 ENCOUNTER — Other Ambulatory Visit (HOSPITAL_COMMUNITY): Payer: Self-pay

## 2024-02-03 MED ORDER — ONDANSETRON 4 MG PO TBDP
4.0000 mg | ORAL_TABLET | Freq: Two times a day (BID) | ORAL | 1 refills | Status: AC | PRN
  Filled 2024-02-03: qty 20, 10d supply, fill #0

## 2024-03-11 ENCOUNTER — Other Ambulatory Visit (HOSPITAL_COMMUNITY): Payer: Self-pay

## 2024-03-11 MED ORDER — TERCONAZOLE 0.4 % VA CREA
1.0000 | TOPICAL_CREAM | Freq: Every day | VAGINAL | 0 refills | Status: DC
  Filled 2024-03-11: qty 45, 7d supply, fill #0

## 2024-03-17 ENCOUNTER — Other Ambulatory Visit (HOSPITAL_COMMUNITY): Payer: Self-pay

## 2024-03-17 MED ORDER — MOMETASONE FUROATE 0.1 % EX OINT
TOPICAL_OINTMENT | Freq: Every day | CUTANEOUS | 0 refills | Status: AC
  Filled 2024-03-17: qty 15, 30d supply, fill #0

## 2024-03-29 ENCOUNTER — Other Ambulatory Visit (HOSPITAL_COMMUNITY): Payer: Self-pay

## 2024-05-03 ENCOUNTER — Other Ambulatory Visit (HOSPITAL_COMMUNITY): Payer: Self-pay

## 2024-05-03 MED ORDER — PANTOPRAZOLE SODIUM 40 MG PO TBEC
40.0000 mg | DELAYED_RELEASE_TABLET | Freq: Every day | ORAL | 1 refills | Status: AC
  Filled 2024-05-03: qty 30, 30d supply, fill #0

## 2024-05-04 ENCOUNTER — Other Ambulatory Visit (HOSPITAL_COMMUNITY): Payer: Self-pay

## 2024-05-27 ENCOUNTER — Other Ambulatory Visit (HOSPITAL_COMMUNITY): Payer: Self-pay

## 2024-05-27 MED ORDER — MOMETASONE FUROATE 0.1 % EX OINT
1.0000 | TOPICAL_OINTMENT | Freq: Every day | CUTANEOUS | 0 refills | Status: AC
  Filled 2024-05-27: qty 15, 30d supply, fill #0

## 2024-06-08 ENCOUNTER — Other Ambulatory Visit (HOSPITAL_COMMUNITY): Payer: Self-pay

## 2024-06-08 MED ORDER — TERCONAZOLE 0.4 % VA CREA
1.0000 | TOPICAL_CREAM | Freq: Every day | VAGINAL | 0 refills | Status: AC
  Filled 2024-06-08 (×2): qty 45, 7d supply, fill #0
